# Patient Record
Sex: Female | Born: 1980 | Race: White | Hispanic: No | Marital: Married | Smoking: Never smoker
Health system: Southern US, Community
[De-identification: ages and names within clinical notes are randomized; demographics above are authoritative.]

## PROBLEM LIST (undated history)

## (undated) DIAGNOSIS — N1330 Unspecified hydronephrosis: Secondary | ICD-10-CM

## (undated) DIAGNOSIS — F410 Panic disorder [episodic paroxysmal anxiety] without agoraphobia: Secondary | ICD-10-CM

## (undated) DIAGNOSIS — L709 Acne, unspecified: Secondary | ICD-10-CM

## (undated) DIAGNOSIS — F4321 Adjustment disorder with depressed mood: Secondary | ICD-10-CM

## (undated) HISTORY — PX: ADENOIDECTOMY: SUR15

## (undated) HISTORY — DX: Panic disorder (episodic paroxysmal anxiety): F41.0

## (undated) HISTORY — PX: KIDNEY SURGERY: SHX687

## (undated) HISTORY — DX: Unspecified hydronephrosis: N13.30

## (undated) HISTORY — DX: Acne, unspecified: L70.9

## (undated) HISTORY — DX: Adjustment disorder with depressed mood: F43.21

---

## 2007-08-11 ENCOUNTER — Ambulatory Visit: Payer: Self-pay | Admitting: Obstetrics and Gynecology

## 2008-02-17 ENCOUNTER — Encounter: Payer: Self-pay | Admitting: Maternal & Fetal Medicine

## 2008-03-30 ENCOUNTER — Encounter: Payer: Self-pay | Admitting: Maternal & Fetal Medicine

## 2008-04-25 ENCOUNTER — Ambulatory Visit: Payer: Self-pay | Admitting: Obstetrics and Gynecology

## 2008-08-18 ENCOUNTER — Inpatient Hospital Stay: Payer: Self-pay | Admitting: Obstetrics and Gynecology

## 2011-04-24 ENCOUNTER — Encounter: Payer: Self-pay | Admitting: Obstetrics and Gynecology

## 2011-06-05 ENCOUNTER — Encounter: Payer: Self-pay | Admitting: Maternal & Fetal Medicine

## 2011-10-06 ENCOUNTER — Observation Stay: Payer: Self-pay | Admitting: Obstetrics and Gynecology

## 2011-10-22 ENCOUNTER — Inpatient Hospital Stay: Payer: Self-pay | Admitting: Obstetrics and Gynecology

## 2013-02-28 ENCOUNTER — Ambulatory Visit: Payer: Self-pay | Admitting: Unknown Physician Specialty

## 2013-03-25 ENCOUNTER — Ambulatory Visit: Payer: Self-pay | Admitting: Urology

## 2013-03-25 LAB — HCG, QUANTITATIVE, PREGNANCY: Beta Hcg, Quant.: <1 m[IU]/mL — ABNORMAL LOW

## 2013-03-29 ENCOUNTER — Ambulatory Visit: Payer: Self-pay | Admitting: Urology

## 2013-04-20 ENCOUNTER — Other Ambulatory Visit: Payer: Self-pay | Admitting: Urology

## 2013-04-20 LAB — COMPREHENSIVE METABOLIC PANEL WITH GFR
Albumin: 3.2 g/dL — ABNORMAL LOW
Alkaline Phosphatase: 62 U/L
Anion Gap: 6 — ABNORMAL LOW
BUN: 14 mg/dL
Bilirubin,Total: 0.3 mg/dL
Calcium, Total: 9.6 mg/dL
Chloride: 104 mmol/L
Co2: 27 mmol/L
Creatinine: 0.88 mg/dL
EGFR (African American): >60
EGFR (Non-African Amer.): >60
Glucose: 78 mg/dL
Osmolality: 273
Potassium: 4.5 mmol/L
SGOT(AST): 22 U/L
SGPT (ALT): 29 U/L
Sodium: 137 mmol/L
Total Protein: 7.6 g/dL

## 2013-04-20 LAB — HCG, QUANTITATIVE, PREGNANCY: Beta Hcg, Quant.: <1 m[IU]/mL — ABNORMAL LOW

## 2013-04-21 ENCOUNTER — Ambulatory Visit: Payer: Self-pay | Admitting: Urology

## 2013-05-06 ENCOUNTER — Other Ambulatory Visit: Payer: Self-pay | Admitting: Urology

## 2013-05-11 ENCOUNTER — Ambulatory Visit: Payer: Self-pay | Admitting: Urology

## 2013-08-01 ENCOUNTER — Ambulatory Visit: Payer: Self-pay | Admitting: Urology

## 2014-03-13 LAB — HM PAP SMEAR: HM Pap smear: NEGATIVE

## 2015-04-01 IMAGING — NM NM RENOGRAM W/ LASIX
4 series · 24 of 24 positions shown · non-contrast
Comparison: none

REASON FOR EXAM: Hydronephrosis
COMMENTS:

[Series 1000: renal · 7.79mm/px · 6 of 100 frames shown]
[frame 9/100]
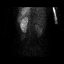
[frame 25/100]
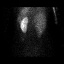
[frame 42/100]
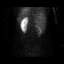
[frame 59/100]
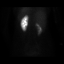
[frame 75/100]
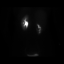
[frame 92/100]
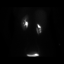

[Series 1000: renal (results) · 7.79mm/px · 6 of 100 frames shown]
[frame 9/100]
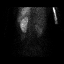
[frame 25/100]
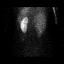
[frame 42/100]
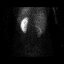
[frame 59/100]
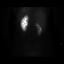
[frame 75/100]
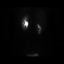
[frame 92/100]
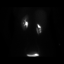

[Series 1000: lasix · 4.80mm/px · 6 of 40 frames shown]
[frame 4/40]
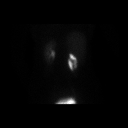
[frame 10/40]
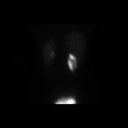
[frame 17/40]
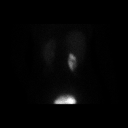
[frame 24/40]
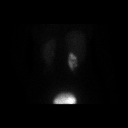
[frame 30/40]
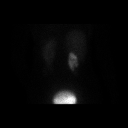
[frame 37/40]
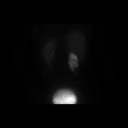

[Series 1000: lasix (results) · 4.80mm/px · 6 of 40 frames shown]
[frame 4/40]
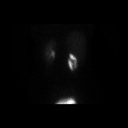
[frame 10/40]
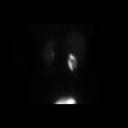
[frame 17/40]
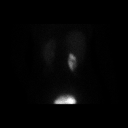
[frame 24/40]
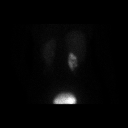
[frame 30/40]
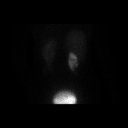
[frame 37/40]
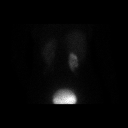

[24 of 24 positions shown; findings below may reference images not displayed]

PROCEDURE:     NM  - NM  RENAL LASIX  2 0F [DATE]  [DATE]

RESULT:     The patient has a double-J left ureteral stent in place. The
patient received a dose of 4.85 mCi of technetium 99 M MAG3 for the study.
The patient also received 20 mg of Lasix intravenously for the washout
portion of the exam. Comparison is made to the previous study performed on
04/21/2013. The patient has also undergone previous CT of the abdomen and
pelvis on 03/25/2013 after which the double-J left ureteral stent was placed
4 long-standing right-sided UPJ obstruction with right renal atrophy.

Arteriographic phase images today shows the presence of delayed accumulation
of tracer in the right kidney compared to the left similar to that seen
previously with evidence of the right kidney smaller than the left
consistent with atrophy. The uptake phase visually and graphically in the
left kidney is slightly blunted which could be from poor bolus or some
extravasation. Percent split function based on the initial images is 82.4%
on the left side and 17.6% on the right. This shows evidence of improvement
in the function of the right kidney compared to the left kidney based on the
previous exam when the differential activity was 94% on the left and 6.025%
on the right. There may be continued improvement with further delay given
the long-standing nature of the obstructive change involving the right
kidney. Correlate with this possibility prior to any intervention.

The patient was given the Lasix administration with further imaging
performed. There is washout of activity from the left kidney and left renal
collecting system with a small amount of activity remaining at the end of 20
minutes of imaging following Lasix administration. The activity in the left
kidney have predominantly been excreted prior to Lasix administration but
there was some washout of activity from the left kidney during the Lasix
washout images. The washout curve of the right kidney shows no evidence of
obstruction with stent in place.
IMPRESSION: 1. There is improvement in function of the right kidney from 6% differential
function to approximately 17.6% differential function compare to the
previous study approximately 3 weeks prior. No obstruction is present with
the stent in place. There is atrophy involving the right kidney and there is
a blunted angiographic phase visually and graphically which could represent
the sequela of continued slightly poor renal function from chronic
long-standing obstructive change.

[REDACTED]

## 2015-04-06 NOTE — Op Note (Signed)
PATIENT NAME:  Lindsay Santos, Lindsay Santos MR#:  [id number] DATE OF BIRTH:  [birth year]  DATE OF PROCEDURE:  03/29/2013  PREOPERATIVE DIAGNOSES:  1.  Right ureteropelvic junction obstruction.  2.  Right hydronephrosis.   POSTOPERATIVE DIAGNOSES: 1.  Right ureteropelvic junction obstruction.  2.  Right hydronephrosis.   PROCEDURE: 1.  Balloon dilatation of right ureteropelvic junction. 2.  Cystoscopy with retrograde pyelogram.  3.  Right double pigtail stent placement.  4.  Fluoroscopy.   SURGEON: Anola GurneyMichael Fawzi Melman, M.D.   ANESTHETIST: Darleene CleaverVan Staveren.   ANESTHESIA: General and local per surgeon.   INDICATIONS: See the dictated history and physical. After informed consent, the patient requests the above procedure.   OPERATIVE SUMMARY: After adequate general anesthesia had been obtained, the patient was placed into dorsal lithotomy position and the perineum was prepped and draped in the usual fashion. A 21-French cystoscope was coupled with a camera and then placed into the bladder. The bladder was thoroughly inspected. Both ureteral orifices were identified and had clear efflux. At this point, an 8-French cone-tipped ureteral catheter was advanced into the right orifice and retrograde pyelogram was performed. The patient had a less than 1 mm narrowing at the UPJ. Contrast was seen entering the kidney, which was dilated. At this point, a 0.035 Glidewire was passed up the right ureter and curled into the renal pelvis under fluoroscopic guidance. An 8 mm x 4 cm balloon dilating catheter was positioned at the UPJ. The UPJ was dilated to 8 mm. The balloon catheter was then removed. A second balloon catheter was placed at the UVJ and the UVJ was dilated to 8 mm also. At this point, an 8-French double pigtail stent was passed over the guidewire and positioned in the ureter. Guidewire was removed taking care to leave the stent in position. The bladder was drained and the scope was removed. Viscous Xylocaine 10 mL was  instilled within the urethra and the bladder. B and O suppository was placed. The procedure was then terminated and the patient was transferred to the recovery room in stable condition.    ____________________________ Suszanne ConnersMichael R. Evelene CroonWolff, MD mrw:aw D: 03/29/2013 08:58:18 ET T: 03/29/2013 09:04:53 ET JOB#: [id number]  cc: Suszanne ConnersMichael R. Evelene CroonWolff, MD, <Dictator> Orson ApeMICHAEL R Michole Lecuyer MD ELECTRONICALLY SIGNED 03/30/2013 9:54

## 2015-04-06 NOTE — H&P (Signed)
PATIENT NAME:  Lindsay Santos, Lindsay Santos DATE OF BIRTH:  June 11, 1981  DATE OF ADMISSION:  08/01/2013  CHIEF COMPLAINT:  Right UPJ obstruction.    HISTORY OF PRESENT ILLNESS:  Lindsay Santos is a 34 year old Caucasian female with right UPJ obstruction, hydronephrosis, and partial loss of renal function on the right side. She underwent balloon dilatation of the UPJ with stent placement April 15. She has had improvement of her renal function, per renal scan, from initial function level of 6% to improved function of 17.6% on May 20. She comes in today for stent retrieval, cysto with stent removal, ureteroscopy, and retrograde pyelogram.   ALLERGIES:  No drug allergies.   CURRENT MEDICATIONS: Includes desipramine, Differin, birth control pills and omeprazole.   PREVIOUS SURGICAL PROCEDURES:  Include tonsillectomy in 1983.   SOCIAL HISTORY:  She denied tobacco use. Consumes alcohol socially.   FAMILY HISTORY: Remarkable for hypercholesterolemia.   PAST AND CURRENT MEDICAL CONDITIONS:  Acne.  REVIEW OF SYSTEMS: The patient denies chest pain, shortness of breath, diabetes, stroke, hypertension.  PHYSICAL EXAMINATION: GENERAL:  A well-nourished white female in no acute distress.  HEENT: Sclerae were clear. Pupils were equally round and reactive to light and accommodation. Extraocular movements intact.  NECK:  Supple. No palpable cervical adenopathy.  LUNGS:  Clear to auscultation.  CARDIOVASCULAR:  Regular rhythm and rate, without audible murmurs.  ABDOMINAL EXAM:  Soft, nontender abdomen. No CVA tenderness.  GENITOURINARY AND RECTAL EXAMS:  Deferred.   NEUROMUSCULAR EXAM:  Alert and oriented x 3.   IMPRESSIONS: 1. Chronic left ureteropelvic junction obstruction, status post balloon dilatation and stent placement.  2. History of abdominal pain due to hydronephrosis and ureteropelvic junction obstruction -resolved.   PLAN:  Cysto with stent retrieval, ureteroscopy, and retrograde  pyelogram.        ____________________________ Suszanne ConnersMichael R. Evelene CroonWolff, MD mrw:mr D: 07/27/2013 17:37:38 ET T: 07/27/2013 19:07:54 ET JOB#: [id number]  cc: Suszanne ConnersMichael R. Evelene CroonWolff, MD, <Dictator> Orson ApeMICHAEL R Geneive Sandstrom MD ELECTRONICALLY SIGNED 08/01/2013 7:24

## 2015-04-06 NOTE — H&P (Signed)
PATIENT NAME:  Lindsay Santos, Lindsay Santos MR#:  [id number] DATE OF BIRTH:  1981/02/08  DATE OF ADMISSION:  03/29/2013  CHIEF COMPLAINT: Abdominal pain.   HISTORY OF PRESENT ILLNESS: The patient is a 34 year old Caucasian female who presented to the office in March with a 4 to 5-day history of periumbilical pain radiating to her right flank. She was subsequently evaluated with IVP and CT which indicated that she had a hydronephrotic sac present on the right side with minimal function. She comes in now for cystoscopy, retrograde stent placement and possible balloon dilatation of the UPJ on the right side.   ALLERGIES: No drug allergies.   CURRENT MEDICATIONS: Desipramine, Differin, birth control pills and omeprazole.   PAST SURGICAL HISTORY: Tonsillectomy in 1983.   SOCIAL HISTORY: She denied tobacco use. She consumes alcohol socially.   FAMILY HISTORY: Remarkable for hypercholesterolemia.   PAST AND CURRENT MEDICAL CONDITIONS: Remarkable for acne.   REVIEW OF SYSTEMS: She denied heart disease, lung disease, diabetes, stroke or hypertension.   PHYSICAL EXAMINATION: GENERAL: A well-nourished white female in no distress.  HEENT: Sclerae were clear. Pupils were equal, round and reactive to light and accommodation. Extraocular movements were intact.  NECK: Supple. No palpable cervical adenopathy.  LUNGS: Clear to auscultation.  CARDIOVASCULAR: Regular rhythm and rate without audible murmurs.  ABDOMEN: Soft abdomen, slightly tender in the mid abdomen. No palpable abdominal masses.   GENITOURINARY: Deferred.  RECTAL: Deferred.  NEUROMUSCULAR: Nonfocal.   IMPRESSION:  1. Severe right hydronephrosis with loss of renal function on the right probably due to chronic right UPJ obstruction.   2. Abdominal pain possibly due to the hydronephrotic sac.   PLAN: Cystoscopy, right retrograde with stent placement, possible ureteroscopy,  and possible balloon dilatation of the UPJ.   ____________________________ Suszanne ConnersMichael R. Evelene CroonWolff, MD mrw:cb D: 03/28/2013 17:16:35 ET T: 03/28/2013 17:29:22 ET JOB#: [id number]  cc: Suszanne ConnersMichael R. Evelene CroonWolff, MD, <Dictator> Orson ApeMICHAEL R Cuca Benassi MD ELECTRONICALLY SIGNED 03/28/2013 19:40

## 2015-04-06 NOTE — Op Note (Signed)
PATIENT NAME:  Lindsay Santos, Lindsay Santos MR#:  [id number] DATE OF BIRTH:  December 12, 1981  DATE OF PROCEDURE:  08/01/2013  PREOPERATIVE DIAGNOSES:  Rhodia Albright1.  Right ureteropelvic junction obstruction.  2.  Right renal atrophy.   POSTOPERATIVE DIAGNOSES: 1.  Right ureteropelvic junction obstruction.  2.  Right renal atrophy.   PROCEDURES:  1.  Cystoscopy with stent removal.  2.  Right retrograde pyelogram.  3.  Fluoroscopy.   SURGEON: Anola GurneyMichael Wolff, M.D.   ANESTHETIST: Linna Hoffarroll and Wolff.  ANESTHETIC METHOD: General per Noralyn Pickarroll and local per Dr. Evelene CroonWolff.   INDICATIONS: See the dictated history and physical. After informed consent, the patient requests the above procedure.   OPERATIVE SUMMARY: After adequate general anesthesia had been obtained, the patient was placed into dorsal lithotomy position and the perineum was prepped and draped in the usual fashion. A 21-French cystoscope sheath was advanced in the bladder with the obturator in place. Cystoscope was coupled with the camera and then placed into the sheath. The bladder was thoroughly inspected. Right ureteral stent was identified. No bladder lesions were identified. Using the alligator forceps, the stent was removed. An 8-French cone-tipped ureteral catheter was then advanced into the right orifice and retrograde pyelogram was performed with fluoroscopy and static images. The UPJ appeared to be patent and drainage films showed prompt drainage of contrast out of the kidney. At this point, the bladder was drained and cystoscope was removed. 10 mL of viscous Xylocaine was instilled within the urethra and the bladder. The procedure was terminated and the patient was transferred to the recovery room in stable condition.    ____________________________ Suszanne ConnersMichael R. Evelene CroonWolff, MD mrw:aw D: 08/01/2013 08:11:14 ET T: 08/01/2013 08:25:45 ET JOB#: [id number]  cc: Suszanne ConnersMichael R. Evelene CroonWolff, MD, <Dictator> Orson ApeMICHAEL R WOLFF MD ELECTRONICALLY SIGNED 08/02/2013 8:26

## 2015-09-25 ENCOUNTER — Ambulatory Visit: Payer: Self-pay | Admitting: Obstetrics and Gynecology

## 2016-02-22 ENCOUNTER — Telehealth: Payer: Self-pay | Admitting: Obstetrics and Gynecology

## 2016-02-22 NOTE — Telephone Encounter (Signed)
REFILL ON ANXIETY MED, NEEDS FOR FLIGHT OUT OF COUNTRY  (TRIAZOLAM)

## 2016-02-22 NOTE — Telephone Encounter (Signed)
LM informing pt will have to ask mad for refill.

## 2016-03-18 NOTE — Telephone Encounter (Signed)
Pt aware she will need to get from dr who ordered. Pt states mad ordered- I see where he ordered ativan after mother passed away. See allscript note dated 12/12/2014. Pt will f/u with mad at her [facility] in a few weeks.

## 2016-04-01 ENCOUNTER — Encounter: Payer: Self-pay | Admitting: Obstetrics and Gynecology

## 2016-04-01 ENCOUNTER — Ambulatory Visit (INDEPENDENT_AMBULATORY_CARE_PROVIDER_SITE_OTHER): Payer: Managed Care, Other (non HMO) | Admitting: Obstetrics and Gynecology

## 2016-04-01 VITALS — BP 104/68 | HR 71 | Ht 64.0 in | Wt 124.4 lb

## 2016-04-01 DIAGNOSIS — Z01419 Encounter for gynecological examination (general) (routine) without abnormal findings: Secondary | ICD-10-CM | POA: Diagnosis not present

## 2016-04-01 DIAGNOSIS — F410 Panic disorder [episodic paroxysmal anxiety] without agoraphobia: Secondary | ICD-10-CM

## 2016-04-01 DIAGNOSIS — Z3041 Encounter for surveillance of contraceptive pills: Secondary | ICD-10-CM | POA: Diagnosis not present

## 2016-04-01 MED ORDER — JUNEL FE 1/20 1-20 MG-MCG PO TABS: 1.0000 | ORAL_TABLET | Freq: Every day | ORAL | Status: DC

## 2016-04-01 NOTE — Patient Instructions (Signed)
1. No Pap smear until 2018 2. Self breast exam monthly encouraged 3. Continue healthy eating with exercise 4. Screening labs are pending today 5. Birth control pill prescription refilled 6. Return in 1 year 7. No medications for panic/anxiety at this time. If medication is needed, contact us

## 2016-04-01 NOTE — Progress Notes (Signed)
Patient ID: Lindsay Santos, female   DOB: 12-Apr-1981, 35 y.o.   MRN: [id number] [facility]NUAL [facility] GYN  ENCOUNTER NOTE  Subjective:       Lindsay Santos is a 35 y.o. [phone] female here for a routine annual gynecologic exam.  Current complaints: 1.  D/c paxil- pt not longer needs   Wants halcion   Cycles are regular on OCPs without breakthrough bleeding Bowel function normal Bladder function normal Patient reports discontinuing Paxil for panic/anxiety and is doing much better. She came off medication following grief counseling after mother's and is pleased being off all meds. Patient states that she still has some anxiety with prolonged air travel; should she require medication future, she will contact the office. She does understand that I will not prescribe long-term benzodiazepines. After reviewing Halcion prescription, this short acting benzodiazepine may be utilized in select circumstances.   Gynecologic History Patient's last menstrual period was 03/04/2016 (approximate).  Pap- 02/14/2014 neg/neg Contraception: OCP (estrogen/progesterone)  Last mammogram: n/a. Results were: n/a  Obstetric History OB History  Gravida Para Term Preterm AB SAB TAB Ectopic Multiple Living  # Outcome Date GA Lbr Len/2nd Weight Sex Delivery Anes PTL Lv  2 Term      Vag-Spont   Y  1 Term      VBAC   Y      Past Medical History  Diagnosis Date  . Hydronephrosis   . Panic anxiety syndrome   . Grief     Past Surgical History  Procedure Laterality Date  . Kidney surgery      rt kidney stent and removal  . Adenoidectomy      Current Outpatient Prescriptions on File Prior to Visit  Medication Sig Dispense Refill  . JUNEL FE 1/20 1-20 MG-MCG tablet TAKE 1 TABLET BY MOUTH DAILY CONTINUOUSLY  4   No current facility-administered medications on file prior to visit.    No Known Allergies  Social History   Social History  . Marital Status: Married    Spouse Name:  N/A  . Number of Children: N/A  . Years of Education: N/A   Occupational History  . Not on file.   Social History Main Topics  . Smoking status: Never Smoker   . Smokeless tobacco: Not on file  . Alcohol Use: Yes     Comment: moderate alcohol - glass of wine at nite  . Drug Use: No  . Sexual Activity: Yes    Birth Control/ Protection: Pill   Other Topics Concern  . Not on file   Social History Narrative    Family History  Problem Relation Age of Onset  . Cancer Neg Hx   . Diabetes Neg Hx   . Heart disease Neg Hx     The following portions of the patient's history were reviewed and updated as appropriate: allergies, current medications, past family history, past medical history, past social history, past surgical history and problem list.  Review of Systems ROS Review of Systems - General ROS: negative for - chills, fatigue, fever, hot flashes, night sweats, weight gain or weight loss Psychological ROS: negative for - anxiety, decreased libido, depression, mood swings, physical abuse or sexual abuse Ophthalmic ROS: negative for - blurry vision, eye pain or loss of vision ENT ROS: negative for - headaches, hearing change, visual changes or vocal changes Allergy and Immunology ROS: negative for - hives, itchy/watery eyes or seasonal  allergies Hematological and Lymphatic ROS: negative for - bleeding problems, bruising, swollen lymph nodes or weight loss Endocrine ROS: negative for - galactorrhea, hair pattern changes, hot flashes, malaise/lethargy, mood swings, palpitations, polydipsia/polyuria, skin changes, temperature intolerance or unexpected weight changes Breast ROS: negative for - new or changing breast lumps or nipple discharge Respiratory ROS: negative for - cough or shortness of breath Cardiovascular ROS: negative for - chest pain, irregular heartbeat, palpitations or shortness of breath Gastrointestinal ROS: no abdominal pain, change in bowel habits, or black or  bloody stools Genito-Urinary ROS: no dysuria, trouble voiding, or hematuria Musculoskeletal ROS: negative for - joint pain or joint stiffness Neurological ROS: negative for - bowel and bladder control changes Dermatological ROS: negative for rash and skin lesion changes   Objective:   BP 104/68 mmHg  Pulse 71  Ht 5\' 4"  (1.626 m)  Wt 124 lb 6.4 oz (56.427 kg)  BMI 21.34 kg/m2  LMP 03/04/2016 (Approximate) CONSTITUTIONAL: Well-developed, well-nourished female in no acute distress.  PSYCHIATRIC: Normal mood and affect. Normal behavior. Normal judgment and thought content. NEUROLGIC: Alert and oriented to person, place, and time. Normal muscle tone coordination. No cranial nerve deficit noted. HENT:  Normocephalic, atraumatic, External right and left ear normal. Oropharynx is clear and moist EYES: Conjunctivae and EOM are normal.  No scleral icterus.  NECK: Normal range of motion, supple, no masses.  Normal thyroid.  SKIN: Skin is warm and dry. No rash noted. Not diaphoretic. No erythema. No pallor. CARDIOVASCULAR: Normal heart rate noted, regular rhythm, no murmur. RESPIRATORY: Clear to auscultation bilaterally. Effort and breath sounds normal, no problems with respiration noted. BREASTS: Symmetric in size. No masses, skin changes, nipple drainage, or lymphadenopathy. ABDOMEN: Soft, normal bowel sounds, no distention noted.  No tenderness, rebound or guarding.  BLADDER: Normal PELVIC:  External Genitalia: Normal  BUS: Normal  Vagina: Normal  Cervix: Normal; friable  Uterus: Normal  Adnexa: Normal  RV: External Exam NormaI  MUSCULOSKELETAL: Normal range of motion. No tenderness.  No cyanosis, clubbing, or edema.  2+ distal pulses. LYMPHATIC: No Axillary, Supraclavicular, or Inguinal Adenopathy.    Assessment:   Annual gynecologic examination 35 y.o. Contraception: OCP (estrogen/progesterone) bmi-21 Panic/anxiety, off medications, stable  Plan:  Pap: Not needed Mammogram:  Not Indicated Stool Guaiac Testing:  Not Indicated Labs: lipid vit tsh fbs a1c and CBC Routine preventative health maintenance measures emphasized: Exercise/Diet/Weight control, Tobacco Warnings, Alcohol/Substance use risks and Stress Management OCPs refilled Return to Clinic - 1 Year   Tylar Amborn McCartys VillageMiller, CMA  Herold HarmsMartin A Defrancesco, MD  Note: This dictation was prepared with Dragon dictation along with smaller phrase technology. Any transcriptional errors that result from this process are unintentional.

## 2016-04-02 ENCOUNTER — Telehealth: Payer: Self-pay

## 2016-04-02 LAB — CBC WITH DIFFERENTIAL/PLATELET
BASOS ABS: 0 10*3/uL (ref 0.0–0.2)
Basos: 1 %
EOS (ABSOLUTE): 0.1 10*3/uL (ref 0.0–0.4)
Eos: 1 %
Hematocrit: 46.6 % (ref 34.0–46.6)
Hemoglobin: 15.5 g/dL (ref 11.1–15.9)
IMMATURE GRANS (ABS): 0 10*3/uL (ref 0.0–0.1)
Immature Granulocytes: 0 %
LYMPHS: 32 %
Lymphocytes Absolute: 2.1 10*3/uL (ref 0.7–3.1)
MCH: 30.6 pg (ref 26.6–33.0)
MCHC: 33.3 g/dL (ref 31.5–35.7)
MCV: 92 fL (ref 79–97)
Monocytes Absolute: 0.4 10*3/uL (ref 0.1–0.9)
Monocytes: 7 %
NEUTROS ABS: 3.9 10*3/uL (ref 1.4–7.0)
NEUTROS PCT: 59 %
PLATELETS: 255 10*3/uL (ref 150–379)
RBC: 5.06 x10E6/uL (ref 3.77–5.28)
RDW: 13.3 % (ref 12.3–15.4)
WBC: 6.6 10*3/uL (ref 3.4–10.8)

## 2016-04-02 LAB — GLUCOSE, RANDOM: GLUCOSE: 88 mg/dL (ref 65–99)

## 2016-04-02 LAB — LIPID PANEL
CHOLESTEROL TOTAL: 211 mg/dL — AB (ref 100–199)
Chol/HDL Ratio: 3.3 ratio units (ref 0.0–4.4)
HDL: 63 mg/dL (ref >39)
LDL CALC: 122 mg/dL — AB (ref 0–99)
Triglycerides: 131 mg/dL (ref 0–149)
VLDL Cholesterol Cal: 26 mg/dL (ref 5–40)

## 2016-04-02 LAB — VITAMIN D 25 HYDROXY (VIT D DEFICIENCY, FRACTURES): VIT D 25 HYDROXY: 29.1 ng/mL — AB (ref 30.0–100.0)

## 2016-04-02 LAB — TSH: TSH: 2.65 u[IU]/mL (ref 0.450–4.500)

## 2016-04-02 LAB — HEMOGLOBIN A1C
Est. average glucose Bld gHb Est-mCnc: 111 mg/dL
Hgb A1c MFr Bld: 5.5 % (ref 4.8–5.6)

## 2016-04-02 NOTE — Telephone Encounter (Signed)
-----   Message from Herold HarmsMartin A Defrancesco, MD sent at 04/02/2016  8:22 AM EDT ----- Please Notify - Labs normal Bimanual borderline normal Lipid panel shows slightly elevated total cholesterol and LDL; however ratio is consistent with low risk patient for cardiac disease; repeat in one year

## 2016-04-02 NOTE — Telephone Encounter (Signed)
Pt aware.

## 2016-05-28 ENCOUNTER — Other Ambulatory Visit: Payer: Self-pay | Admitting: Obstetrics and Gynecology

## 2017-04-02 ENCOUNTER — Other Ambulatory Visit: Payer: Self-pay | Admitting: Obstetrics and Gynecology

## 2017-04-02 ENCOUNTER — Encounter: Payer: Managed Care, Other (non HMO) | Admitting: Obstetrics and Gynecology

## 2017-04-16 NOTE — Progress Notes (Signed)
Patient ID: Lindsay Santos, female   DOB: [birth year], 36 y.o.   MRN: [id number] ANNUAL PREVENTATIVE CARE GYN  ENCOUNTER NOTE  Subjective:       Lindsay Santos is a 36 y.o. [id number] female here for a routine annual gynecologic exam.  Current complaints: 1. Wants ativan refill- traveling soon  Cycles are regular on OCPs without breakthrough bleeding Bowel function normal Bladder function normal Patient reports discontinuing Paxil for panic/anxiety and is doing much better. She came off medication following grief counseling after mother's and is pleased being off all meds. Patient states that she still has some anxiety with prolonged air travel; should she require medication future, she will contact the office. She does understand that I will not prescribe long-term benzodiazepines. After reviewing Halcion prescription, this short acting benzodiazepine may be utilized in select circumstances.   Gynecologic History No LMP recorded.  Pap- 02/14/2014 neg/neg Contraception: OCP (estrogen/progesterone)  Last mammogram: n/a. Results were: n/a  Obstetric History OB History  Gravida Para Term Preterm AB Living  2 2 2     2   SAB TAB Ectopic Multiple Live Births          2    # Outcome Date GA Lbr Len/2nd Weight Sex Delivery Anes PTL Lv  2 Term      Vag-Spont   LIV  1 Term      VBAC   LIV      Past Medical History:  Diagnosis Date  . Grief   . Hydronephrosis   . Panic anxiety syndrome     Past Surgical History:  Procedure Laterality Date  . ADENOIDECTOMY    . KIDNEY SURGERY     rt kidney stent and removal    Current Outpatient Prescriptions on File Prior to Visit  Medication Sig Dispense Refill  . ACZONE 7.5 % GEL     . doxycycline (ORACEA) 40 MG capsule Take 40 mg by mouth every morning.    Colleen Can FE 1/20 1-20 MG-MCG tablet TAKE 1 TABLET BY MOUTH DAILY CONTINUOUSLY 112 tablet 3  . JUNEL FE 1/20 1-20 MG-MCG tablet TAKE 1 TABLET BY MOUTH DAILY. 84 tablet 0   No current  facility-administered medications on file prior to visit.     No Known Allergies  Social History   Social History  . Marital status: Married    Spouse name: N/A  . Number of children: N/A  . Years of education: N/A   Occupational History  . Not on file.   Social History Main Topics  . Smoking status: Never Smoker  . Smokeless tobacco: Not on file  . Alcohol use Yes     Comment: moderate alcohol - glass of wine at nite  . Drug use: No  . Sexual activity: Yes    Birth control/ protection: Pill   Other Topics Concern  . Not on file   Social History Narrative  . No narrative on file    Family History  Problem Relation Age of Onset  . Cancer Neg Hx   . Diabetes Neg Hx   . Heart disease Neg Hx     The following portions of the patient's history were reviewed and updated as appropriate: allergies, current medications, past family history, past medical history, past social history, past surgical history and problem list.  Review of Systems Review of Systems  Constitutional: Negative.   HENT: Negative.   Eyes: Negative.   Respiratory: Negative.   Cardiovascular: Negative.   Gastrointestinal: Negative.  Genitourinary: Negative.   Musculoskeletal: Negative.   Skin: Negative.   Endo/Heme/Allergies: Negative.   Psychiatric/Behavioral: The patient is nervous/anxious.      Objective:   BP 95/67   Pulse 84   Ht 5\' 4"  (1.626 m)   Wt 118 lb 11.2 oz (53.8 kg)   LMP 04/21/2017 (Exact Date)   BMI 20.37 kg/m CONSTITUTIONAL: Well-developed, well-nourished female in no acute distress.  PSYCHIATRIC: Normal mood and affect. Normal behavior. Normal judgment and thought content. NEUROLGIC: Alert and oriented to person, place, and time. Normal muscle tone coordination. No cranial nerve deficit noted. HENT:  Normocephalic, atraumatic, External right and left ear normal. Oropharynx is clear and moist EYES: Conjunctivae and EOM are normal.  No scleral icterus.  NECK: Normal  range of motion, supple, no masses.  Normal thyroid.  SKIN: Skin is warm and dry. No rash noted. Not diaphoretic. No erythema. No pallor. CARDIOVASCULAR: Normal heart rate noted, regular rhythm, no murmur. RESPIRATORY: Clear to auscultation bilaterally. Effort and breath sounds normal, no problems with respiration noted. BREASTS: Symmetric in size. No masses, skin changes, nipple drainage, or lymphadenopathy. ABDOMEN: Soft, normal bowel sounds, no distention noted.  No tenderness, rebound or guarding.  BLADDER: Normal PELVIC:  External Genitalia: Normal  BUS: Normal  Vagina: Normal  Cervix: Normal; friable  Uterus: Normal; midplane to anteverted, small, mobile, nontender  Adnexa: Normal; nonpalpable nontender  RV: External Exam NormaI  MUSCULOSKELETAL: Normal range of motion. No tenderness.  No cyanosis, clubbing, or edema.  2+ distal pulses. LYMPHATIC: No Axillary, Supraclavicular, or Inguinal Adenopathy.    Assessment:   Annual gynecologic examination 36 y.o. Contraception: OCP (estrogen/progesterone) bmi-21 Panic/anxiety, off medications, stable Major travel upcoming, requesting short acting benzodiazepine (previously prescribed)  Plan:  Pap: pap w/hpv Mammogram: Not Indicated Stool Guaiac Testing:  Not Indicated Labs: lipid vit tsh fbs a1c  Routine preventative health maintenance measures emphasized: Exercise/Diet/Weight control, Tobacco Warnings, Alcohol/Substance use risks and Stress Management OCPs refilled Ativan 1 mg orally every 4 hours as needed during travel; 15 tabs; no refills Return to Clinic - 1 Year  SunGardCrystal Miller, CMA  Herold HarmsMartin A Semaya Vida, MD   Note: This dictation was prepared with Dragon dictation along with smaller phrase technology. Any transcriptional errors that result from this process are unintentional.

## 2017-04-22 ENCOUNTER — Ambulatory Visit (INDEPENDENT_AMBULATORY_CARE_PROVIDER_SITE_OTHER): Payer: Managed Care, Other (non HMO) | Admitting: Obstetrics and Gynecology

## 2017-04-22 ENCOUNTER — Encounter: Payer: Self-pay | Admitting: Obstetrics and Gynecology

## 2017-04-22 VITALS — BP 95/67 | HR 84 | Ht 64.0 in | Wt 118.7 lb

## 2017-04-22 DIAGNOSIS — F410 Panic disorder [episodic paroxysmal anxiety] without agoraphobia: Secondary | ICD-10-CM | POA: Diagnosis not present

## 2017-04-22 DIAGNOSIS — Z01419 Encounter for gynecological examination (general) (routine) without abnormal findings: Secondary | ICD-10-CM

## 2017-04-22 DIAGNOSIS — Z3041 Encounter for surveillance of contraceptive pills: Secondary | ICD-10-CM

## 2017-04-22 MED ORDER — NORETHIN ACE-ETH ESTRAD-FE 1-20 MG-MCG PO TABS: 1.0000 | ORAL_TABLET | Freq: Every day | ORAL | 3 refills | Status: DC

## 2017-04-22 MED ORDER — LORAZEPAM 1 MG PO TABS: 1.0000 mg | ORAL_TABLET | Freq: Two times a day (BID) | ORAL | 0 refills | Status: DC

## 2017-04-22 NOTE — Patient Instructions (Addendum)
1. Pap smear is done 2. Continue self breast awareness 3. Screening labs are ordered 4. Continue with healthy eating and exercise 5. Birth control pills are refilled for 1 year 6. Return in 1 year for annual exam 7. Ativan 1 mg tabs; over 50; no refill Health Maintenance, Female Adopting a healthy lifestyle and getting preventive care can go a long way to promote health and wellness. Talk with your health care provider about what schedule of regular examinations is right for you. This is a good chance for you to check in with your provider about disease prevention and staying healthy. In between checkups, there are plenty of things you can do on your own. Experts have done a lot of research about which lifestyle changes and preventive measures are most likely to keep you healthy. Ask your health care provider for more information. Weight and diet Eat a healthy diet  Be sure to include plenty of vegetables, fruits, low-fat dairy products, and lean protein.  Do not eat a lot of foods high in solid fats, added sugars, or salt.  Get regular exercise. This is one of the most important things you can do for your health.  Most adults should exercise for at least 150 minutes each week. The exercise should increase your heart rate and make you sweat (moderate-intensity exercise).  Most adults should also do strengthening exercises at least twice a week. This is in addition to the moderate-intensity exercise. Maintain a healthy weight  Body mass index (BMI) is a measurement that can be used to identify possible weight problems. It estimates body fat based on height and weight. Your health care provider can help determine your BMI and help you achieve or maintain a healthy weight.  For females 51 years of age and older:  A BMI below 18.5 is considered underweight.  A BMI of 18.5 to 24.9 is normal.  A BMI of 25 to 29.9 is considered overweight.  A BMI of 30 and above is considered obese. Watch  levels of cholesterol and blood lipids  You should start having your blood tested for lipids and cholesterol at 38 years of age, then have this test every 5 years.  You may need to have your cholesterol levels checked more often if:  Your lipid or cholesterol levels are high.  You are older than 38 years of age.  You are at high risk for heart disease. Cancer screening Lung Cancer  Lung cancer screening is recommended for adults 60-75 years old who are at high risk for lung cancer because of a history of smoking.  A yearly low-dose CT scan of the lungs is recommended for people who:  Currently smoke.  Have quit within the past 15 years.  Have at least a 30-pack-year history of smoking. A pack year is smoking an average of one pack of cigarettes a day for 1 year.  Yearly screening should continue until it has been 15 years since you quit.  Yearly screening should stop if you develop a health problem that would prevent you from having lung cancer treatment. Breast Cancer  Practice breast self-awareness. This means understanding how your breasts normally appear and feel.  It also means doing regular breast self-exams. Let your health care provider know about any changes, no matter how small.  If you are in your 20s or 30s, you should have a clinical breast exam (CBE) by a health care provider every 1-3 years as part of a regular health exam.  If you  are 78 or older, have a CBE every year. Also consider having a breast X-ray (mammogram) every year.  If you have a family history of breast cancer, talk to your health care provider about genetic screening.  If you are at high risk for breast cancer, talk to your health care provider about having an MRI and a mammogram every year.  Breast cancer gene (BRCA) assessment is recommended for women who have family members with BRCA-related cancers. BRCA-related cancers include:  Breast.  Ovarian.  Tubal.  Peritoneal  cancers.  Results of the assessment will determine the need for genetic counseling and BRCA1 and BRCA2 testing. Cervical Cancer  Your health care provider may recommend that you be screened regularly for cancer of the pelvic organs (ovaries, uterus, and vagina). This screening involves a pelvic examination, including checking for microscopic changes to the surface of your cervix (Pap test). You may be encouraged to have this screening done every 3 years, beginning at age 54.  For women ages 36-65, health care providers may recommend pelvic exams and Pap testing every 3 years, or they may recommend the Pap and pelvic exam, combined with testing for human papilloma virus (HPV), every 5 years. Some types of HPV increase your risk of cervical cancer. Testing for HPV may also be done on women of any age with unclear Pap test results.  Other health care providers may not recommend any screening for nonpregnant women who are considered low risk for pelvic cancer and who do not have symptoms. Ask your health care provider if a screening pelvic exam is right for you.  If you have had past treatment for cervical cancer or a condition that could lead to cancer, you need Pap tests and screening for cancer for at least 20 years after your treatment. If Pap tests have been discontinued, your risk factors (such as having a new sexual partner) need to be reassessed to determine if screening should resume. Some women have medical problems that increase the chance of getting cervical cancer. In these cases, your health care provider may recommend more frequent screening and Pap tests. Colorectal Cancer  This type of cancer can be detected and often prevented.  Routine colorectal cancer screening usually begins at 38 years of age and continues through 38 years of age.  Your health care provider may recommend screening at an earlier age if you have risk factors for colon cancer.  Your health care provider may also  recommend using home test kits to check for hidden blood in the stool.  A small camera at the end of a tube can be used to examine your colon directly (sigmoidoscopy or colonoscopy). This is done to check for the earliest forms of colorectal cancer.  Routine screening usually begins at age 43.  Direct examination of the colon should be repeated every 5-10 years through 38 years of age. However, you may need to be screened more often if early forms of precancerous polyps or small growths are found. Skin Cancer  Check your skin from head to toe regularly.  Tell your health care provider about any new moles or changes in moles, especially if there is a change in a mole's shape or color.  Also tell your health care provider if you have a mole that is larger than the size of a pencil eraser.  Always use sunscreen. Apply sunscreen liberally and repeatedly throughout the day.  Protect yourself by wearing long sleeves, pants, a wide-brimmed hat, and sunglasses whenever you are  outside. Heart disease, diabetes, and high blood pressure  High blood pressure causes heart disease and increases the risk of stroke. High blood pressure is more likely to develop in:  People who have blood pressure in the high end of the normal range (130-139/85-89 mm Hg).  People who are overweight or obese.  People who are African American.  If you are 61-54 years of age, have your blood pressure checked every 3-5 years. If you are 73 years of age or older, have your blood pressure checked every year. You should have your blood pressure measured twice-once when you are at a hospital or clinic, and once when you are not at a hospital or clinic. Record the average of the two measurements. To check your blood pressure when you are not at a hospital or clinic, you can use:  An automated blood pressure machine at a pharmacy.  A home blood pressure monitor.  If you are between 53 years and 82 years old, ask your health  care provider if you should take aspirin to prevent strokes.  Have regular diabetes screenings. This involves taking a blood sample to check your fasting blood sugar level.  If you are at a normal weight and have a low risk for diabetes, have this test once every three years after 38 years of age.  If you are overweight and have a high risk for diabetes, consider being tested at a younger age or more often. Preventing infection Hepatitis B  If you have a higher risk for hepatitis B, you should be screened for this virus. You are considered at high risk for hepatitis B if:  You were born in a country where hepatitis B is common. Ask your health care provider which countries are considered high risk.  Your parents were born in a high-risk country, and you have not been immunized against hepatitis B (hepatitis B vaccine).  You have HIV or AIDS.  You use needles to inject street drugs.  You live with someone who has hepatitis B.  You have had sex with someone who has hepatitis B.  You get hemodialysis treatment.  You take certain medicines for conditions, including cancer, organ transplantation, and autoimmune conditions. Hepatitis C  Blood testing is recommended for:  Everyone born from [birth year] through 1967.  Anyone with known risk factors for hepatitis C. Sexually transmitted infections (STIs)  You should be screened for sexually transmitted infections (STIs) including gonorrhea and chlamydia if:  You are sexually active and are younger than 38 years of age.  You are older than 38 years of age and your health care provider tells you that you are at risk for this type of infection.  Your sexual activity has changed since you were last screened and you are at an increased risk for chlamydia or gonorrhea. Ask your health care provider if you are at risk.  If you do not have HIV, but are at risk, it may be recommended that you take a prescription medicine daily to prevent HIV  infection. This is called pre-exposure prophylaxis (PrEP). You are considered at risk if:  You are sexually active and do not regularly use condoms or know the HIV status of your partner(s).  You take drugs by injection.  You are sexually active with a partner who has HIV. Talk with your health care provider about whether you are at high risk of being infected with HIV. If you choose to begin PrEP, you should first be tested for HIV. You should  then be tested every 3 months for as long as you are taking PrEP. Pregnancy  If you are premenopausal and you may become pregnant, ask your health care provider about preconception counseling.  If you may become pregnant, take 400 to 800 micrograms (mcg) of folic acid every day.  If you want to prevent pregnancy, talk to your health care provider about birth control (contraception). Osteoporosis and menopause  Osteoporosis is a disease in which the bones lose minerals and strength with aging. This can result in serious bone fractures. Your risk for osteoporosis can be identified using a bone density scan.  If you are 37 years of age or older, or if you are at risk for osteoporosis and fractures, ask your health care provider if you should be screened.  Ask your health care provider whether you should take a calcium or vitamin D supplement to lower your risk for osteoporosis.  Menopause may have certain physical symptoms and risks.  Hormone replacement therapy may reduce some of these symptoms and risks. Talk to your health care provider about whether hormone replacement therapy is right for you. Follow these instructions at home:  Schedule regular health, dental, and eye exams.  Stay current with your immunizations.  Do not use any tobacco products including cigarettes, chewing tobacco, or electronic cigarettes.  If you are pregnant, do not drink alcohol.  If you are breastfeeding, limit how much and how often you drink alcohol.  Limit  alcohol intake to no more than 1 drink per day for nonpregnant women. One drink equals 12 ounces of beer, 5 ounces of wine, or 1 ounces of hard liquor.  Do not use street drugs.  Do not share needles.  Ask your health care provider for help if you need support or information about quitting drugs.  Tell your health care provider if you often feel depressed.  Tell your health care provider if you have ever been abused or do not feel safe at home. This information is not intended to replace advice given to you by your health care provider. Make sure you discuss any questions you have with your health care provider. Document Released: 06/16/2011 Document Revised: 05/08/2016 Document Reviewed: 09/04/2015 Elsevier Interactive Patient Education  2017 [employer].

## 2017-04-23 LAB — LIPID PANEL
CHOLESTEROL TOTAL: 201 mg/dL — AB (ref 100–199)
Chol/HDL Ratio: 3 ratio (ref 0.0–4.4)
HDL: 66 mg/dL (ref >39)
LDL Calculated: 117 mg/dL — ABNORMAL HIGH (ref 0–99)
TRIGLYCERIDES: 88 mg/dL (ref 0–149)
VLDL Cholesterol Cal: 18 mg/dL (ref 5–40)

## 2017-04-23 LAB — GLUCOSE, RANDOM: Glucose: 87 mg/dL (ref 65–99)

## 2017-04-23 LAB — VITAMIN D 25 HYDROXY (VIT D DEFICIENCY, FRACTURES): Vit D, 25-Hydroxy: 31.1 ng/mL (ref 30.0–100.0)

## 2017-04-23 LAB — HEMOGLOBIN A1C
ESTIMATED AVERAGE GLUCOSE: 103 mg/dL
Hgb A1c MFr Bld: 5.2 % (ref 4.8–5.6)

## 2017-04-23 LAB — TSH: TSH: 2.62 u[IU]/mL (ref 0.450–4.500)

## 2017-04-25 LAB — PAP IG AND HPV HIGH-RISK
HPV, HIGH-RISK: NEGATIVE
PAP Smear Comment: 0

## 2017-11-24 ENCOUNTER — Other Ambulatory Visit: Payer: Self-pay

## 2017-11-24 MED ORDER — NORETHIN ACE-ETH ESTRAD-FE 1-20 MG-MCG PO TABS: 1.0000 | ORAL_TABLET | Freq: Every day | ORAL | 3 refills | Status: DC

## 2018-04-27 ENCOUNTER — Encounter: Payer: Managed Care, Other (non HMO) | Admitting: Obstetrics and Gynecology

## 2018-04-28 ENCOUNTER — Ambulatory Visit (INDEPENDENT_AMBULATORY_CARE_PROVIDER_SITE_OTHER): Payer: Managed Care, Other (non HMO) | Admitting: Obstetrics and Gynecology

## 2018-04-28 ENCOUNTER — Encounter: Payer: Self-pay | Admitting: Obstetrics and Gynecology

## 2018-04-28 VITALS — BP 86/58 | HR 85 | Ht 64.0 in | Wt 119.9 lb

## 2018-04-28 DIAGNOSIS — Z3041 Encounter for surveillance of contraceptive pills: Secondary | ICD-10-CM

## 2018-04-28 DIAGNOSIS — Z01419 Encounter for gynecological examination (general) (routine) without abnormal findings: Secondary | ICD-10-CM

## 2018-04-28 MED ORDER — NORETHIN ACE-ETH ESTRAD-FE 1-20 MG-MCG PO TABS: 1.0000 | ORAL_TABLET | Freq: Every day | ORAL | 3 refills | Status: DC

## 2018-04-28 NOTE — Patient Instructions (Signed)
1.  No Pap smear is done.  Next Pap smear is due in 2021 2.  Self breast awareness is encouraged.  Mammograms are to start at age 39 3.  Screening labs are ordered 4.  Continue with healthy eating and exercise. 5.  Birth control pills are refilled for 1 year 6.  Return in 1 year for annual exam  Health Maintenance, Female Adopting a healthy lifestyle and getting preventive care can go a long way to promote health and wellness. Talk with your health care provider about what schedule of regular examinations is right for you. This is a good chance for you to check in with your provider about disease prevention and staying healthy. In between checkups, there are plenty of things you can do on your own. Experts have done a lot of research about which lifestyle changes and preventive measures are most likely to keep you healthy. Ask your health care provider for more information. Weight and diet Eat a healthy diet  Be sure to include plenty of vegetables, fruits, low-fat dairy products, and lean protein.  Do not eat a lot of foods high in solid fats, added sugars, or salt.  Get regular exercise. This is one of the most important things you can do for your health. ? Most adults should exercise for at least 150 minutes each week. The exercise should increase your heart rate and make you sweat (moderate-intensity exercise). ? Most adults should also do strengthening exercises at least twice a week. This is in addition to the moderate-intensity exercise.  Maintain a healthy weight  Body mass index (BMI) is a measurement that can be used to identify possible weight problems. It estimates body fat based on height and weight. Your health care provider can help determine your BMI and help you achieve or maintain a healthy weight.  For females 77 years of age and older: ? A BMI below 18.5 is considered underweight. ? A BMI of 18.5 to 24.9 is normal. ? A BMI of 25 to 29.9 is considered overweight. ? A  BMI of 30 and above is considered obese.  Watch levels of cholesterol and blood lipids  You should start having your blood tested for lipids and cholesterol at 39 years of age, then have this test every 5 years.  You may need to have your cholesterol levels checked more often if: ? Your lipid or cholesterol levels are high. ? You are older than 39 years of age. ? You are at high risk for heart disease.  Cancer screening Lung Cancer  Lung cancer screening is recommended for adults 39-50 years old who are at high risk for lung cancer because of a history of smoking.  A yearly low-dose CT scan of the lungs is recommended for people who: ? Currently smoke. ? Have quit within the past 15 years. ? Have at least a 30-pack-year history of smoking. A pack year is smoking an average of one pack of cigarettes a day for 1 year.  Yearly screening should continue until it has been 15 years since you quit.  Yearly screening should stop if you develop a health problem that would prevent you from having lung cancer treatment.  Breast Cancer  Practice breast self-awareness. This means understanding how your breasts normally appear and feel.  It also means doing regular breast self-exams. Let your health care provider know about any changes, no matter how small.  If you are in your 20s or 30s, you should have a clinical breast  exam (CBE) by a health care provider every 1-3 years as part of a regular health exam.  If you are 55 or older, have a CBE every year. Also consider having a breast X-ray (mammogram) every year.  If you have a family history of breast cancer, talk to your health care provider about genetic screening.  If you are at high risk for breast cancer, talk to your health care provider about having an MRI and a mammogram every year.  Breast cancer gene (BRCA) assessment is recommended for women who have family members with BRCA-related cancers. BRCA-related cancers  include: ? Breast. ? Ovarian. ? Tubal. ? Peritoneal cancers.  Results of the assessment will determine the need for genetic counseling and BRCA1 and BRCA2 testing.  Cervical Cancer Your health care provider may recommend that you be screened regularly for cancer of the pelvic organs (ovaries, uterus, and vagina). This screening involves a pelvic examination, including checking for microscopic changes to the surface of your cervix (Pap test). You may be encouraged to have this screening done every 3 years, beginning at age 63.  For women ages 44-65, health care providers may recommend pelvic exams and Pap testing every 3 years, or they may recommend the Pap and pelvic exam, combined with testing for human papilloma virus (HPV), every 5 years. Some types of HPV increase your risk of cervical cancer. Testing for HPV may also be done on women of any age with unclear Pap test results.  Other health care providers may not recommend any screening for nonpregnant women who are considered low risk for pelvic cancer and who do not have symptoms. Ask your health care provider if a screening pelvic exam is right for you.  If you have had past treatment for cervical cancer or a condition that could lead to cancer, you need Pap tests and screening for cancer for at least 20 years after your treatment. If Pap tests have been discontinued, your risk factors (such as having a new sexual partner) need to be reassessed to determine if screening should resume. Some women have medical problems that increase the chance of getting cervical cancer. In these cases, your health care provider may recommend more frequent screening and Pap tests.  Colorectal Cancer  This type of cancer can be detected and often prevented.  Routine colorectal cancer screening usually begins at 39 years of age and continues through 39 years of age.  Your health care provider may recommend screening at an earlier age if you have risk factors  for colon cancer.  Your health care provider may also recommend using home test kits to check for hidden blood in the stool.  A small camera at the end of a tube can be used to examine your colon directly (sigmoidoscopy or colonoscopy). This is done to check for the earliest forms of colorectal cancer.  Routine screening usually begins at age 15.  Direct examination of the colon should be repeated every 5-10 years through 39 years of age. However, you may need to be screened more often if early forms of precancerous polyps or small growths are found.  Skin Cancer  Check your skin from head to toe regularly.  Tell your health care provider about any new moles or changes in moles, especially if there is a change in a mole's shape or color.  Also tell your health care provider if you have a mole that is larger than the size of a pencil eraser.  Always use sunscreen. Apply sunscreen  liberally and repeatedly throughout the day.  Protect yourself by wearing long sleeves, pants, a wide-brimmed hat, and sunglasses whenever you are outside.  Heart disease, diabetes, and high blood pressure  High blood pressure causes heart disease and increases the risk of stroke. High blood pressure is more likely to develop in: ? People who have blood pressure in the high end of the normal range (130-139/85-89 mm Hg). ? People who are overweight or obese. ? People who are African American.  If you are 32-98 years of age, have your blood pressure checked every 3-5 years. If you are 39 years of age or older, have your blood pressure checked every year. You should have your blood pressure measured twice-once when you are at a hospital or clinic, and once when you are not at a hospital or clinic. Record the average of the two measurements. To check your blood pressure when you are not at a hospital or clinic, you can use: ? An automated blood pressure machine at a pharmacy. ? A home blood pressure monitor.  If  you are between 37 years and 72 years old, ask your health care provider if you should take aspirin to prevent strokes.  Have regular diabetes screenings. This involves taking a blood sample to check your fasting blood sugar level. ? If you are at a normal weight and have a low risk for diabetes, have this test once every three years after 39 years of age. ? If you are overweight and have a high risk for diabetes, consider being tested at a younger age or more often. Preventing infection Hepatitis B  If you have a higher risk for hepatitis B, you should be screened for this virus. You are considered at high risk for hepatitis B if: ? You were born in a country where hepatitis B is common. Ask your health care provider which countries are considered high risk. ? Your parents were born in a high-risk country, and you have not been immunized against hepatitis B (hepatitis B vaccine). ? You have HIV or AIDS. ? You use needles to inject street drugs. ? You live with someone who has hepatitis B. ? You have had sex with someone who has hepatitis B. ? You get hemodialysis treatment. ? You take certain medicines for conditions, including cancer, organ transplantation, and autoimmune conditions.  Hepatitis C  Blood testing is recommended for: ? Everyone born from [birth year] through 1967. ? Anyone with known risk factors for hepatitis C.  Sexually transmitted infections (STIs)  You should be screened for sexually transmitted infections (STIs) including gonorrhea and chlamydia if: ? You are sexually active and are younger than 39 years of age. ? You are older than 39 years of age and your health care provider tells you that you are at risk for this type of infection. ? Your sexual activity has changed since you were last screened and you are at an increased risk for chlamydia or gonorrhea. Ask your health care provider if you are at risk.  If you do not have HIV, but are at risk, it may be recommended  that you take a prescription medicine daily to prevent HIV infection. This is called pre-exposure prophylaxis (PrEP). You are considered at risk if: ? You are sexually active and do not regularly use condoms or know the HIV status of your partner(s). ? You take drugs by injection. ? You are sexually active with a partner who has HIV.  Talk with your health care provider about  whether you are at high risk of being infected with HIV. If you choose to begin PrEP, you should first be tested for HIV. You should then be tested every 3 months for as long as you are taking PrEP. Pregnancy  If you are premenopausal and you may become pregnant, ask your health care provider about preconception counseling.  If you may become pregnant, take 400 to 800 micrograms (mcg) of folic acid every day.  If you want to prevent pregnancy, talk to your health care provider about birth control (contraception). Osteoporosis and menopause  Osteoporosis is a disease in which the bones lose minerals and strength with aging. This can result in serious bone fractures. Your risk for osteoporosis can be identified using a bone density scan.  If you are 43 years of age or older, or if you are at risk for osteoporosis and fractures, ask your health care provider if you should be screened.  Ask your health care provider whether you should take a calcium or vitamin D supplement to lower your risk for osteoporosis.  Menopause may have certain physical symptoms and risks.  Hormone replacement therapy may reduce some of these symptoms and risks. Talk to your health care provider about whether hormone replacement therapy is right for you. Follow these instructions at home:  Schedule regular health, dental, and eye exams.  Stay current with your immunizations.  Do not use any tobacco products including cigarettes, chewing tobacco, or electronic cigarettes.  If you are pregnant, do not drink alcohol.  If you are  breastfeeding, limit how much and how often you drink alcohol.  Limit alcohol intake to no more than 1 drink per day for nonpregnant women. One drink equals 12 ounces of beer, 5 ounces of wine, or 1 ounces of hard liquor.  Do not use street drugs.  Do not share needles.  Ask your health care provider for help if you need support or information about quitting drugs.  Tell your health care provider if you often feel depressed.  Tell your health care provider if you have ever been abused or do not feel safe at home. This information is not intended to replace advice given to you by your health care provider. Make sure you discuss any questions you have with your health care provider. Document Released: 06/16/2011 Document Revised: 05/08/2016 Document Reviewed: 09/04/2015 Elsevier Interactive Patient Education  [employer].

## 2018-04-28 NOTE — Progress Notes (Signed)
Patient ID: Lindsay Santos, female   DOB: Jul 15, 1981, 37 y.o.   MRN: [id number] ANNUAL PREVENTATIVE CARE GYN  ENCOUNTER NOTE  Subjective:       Lindsay Santos is a 37 y.o. [phone] female here for a routine annual gynecologic exam.  Current complaints: 1.  None  Cycles are regular on OCPs without breakthrough bleeding Bowel function normal Bladder function normal  No major interval health problems have been identified. .   Gynecologic History Patient's last menstrual period was 02/15/2018 (exact date).  Pap- 04/2017 neg/neg Contraception: OCP (estrogen/progesterone)  Last mammogram: n/a. Results were: n/a  Obstetric History OB History  Gravida Para Term Preterm AB Living  SAB TAB Ectopic Multiple Live Births          2    # Outcome Date GA Lbr Len/2nd Weight Sex Delivery Anes PTL Lv  2 Term      Vag-Spont   LIV  1 Term      VBAC   LIV    Past Medical History:  Diagnosis Date  . Acne   . Grief   . Hydronephrosis   . Panic anxiety syndrome     Past Surgical History:  Procedure Laterality Date  . ADENOIDECTOMY    . KIDNEY SURGERY     rt kidney stent and removal    Current Outpatient Medications on File Prior to Visit  Medication Sig Dispense Refill  . norethindrone-ethinyl estradiol (JUNEL FE 1/20) 1-20 MG-MCG tablet Take 1 tablet by mouth daily. 84 tablet 3  . spironolactone (ALDACTONE) 50 MG tablet Take 50 mg by mouth daily.     No current facility-administered medications on file prior to visit.     No Known Allergies  Social History   Socioeconomic History  . Marital status: Married    Spouse name: Not on file  . Number of children: Not on file  . Years of education: Not on file  . Highest education level: Not on file  Occupational History  . Not on file  Social Needs  . Financial resource strain: Not on file  . Food insecurity:    Worry: Not on file    Inability: Not on file  . Transportation needs:    Medical: Not on file    Non-medical: Not on file  Tobacco Use  . Smoking status: Never Smoker  . Smokeless tobacco: Never Used  Substance and Sexual Activity  . Alcohol use: Yes    Comment: moderate alcohol - glass of wine at nite  . Drug use: No  . Sexual activity: Yes    Birth control/protection: Pill  Lifestyle  . Physical activity:    Days per week: Not on file    Minutes per session: Not on file  . Stress: Not on file  Relationships  . Social connections:    Talks on phone: Not on file    Gets together: Not on file    Attends religious service: Not on file    Active member of club or organization: Not on file    Attends meetings of clubs or organizations: Not on file    Relationship status: Not on file  . Intimate partner violence:    Fear of current or ex partner: Not on file    Emotionally abused: Not on file    Physically abused: Not on file    Forced sexual activity: Not on file  Other Topics Concern  . Not on  file  Social History Narrative  . Not on file    Family History  Problem Relation Age of Onset  . Cancer Neg Hx   . Diabetes Neg Hx   . Heart disease Neg Hx     The following portions of the patient's history were reviewed and updated as appropriate: allergies, current medications, past family history, past medical history, past social history, past surgical history and problem list.  Review of Systems  Review of Systems  Constitutional: Negative.   HENT: Negative.   Eyes: Negative.   Respiratory: Negative.   Cardiovascular: Negative.   Gastrointestinal: Negative.   Genitourinary: Negative.   Musculoskeletal: Negative.   Skin: Negative.   Neurological: Negative.   Endo/Heme/Allergies: Negative.   Psychiatric/Behavioral: Negative.      Objective:   BP (!) 86/58   Pulse 85   Ht  (1.626 m)   Wt 119 lb 14.4 oz (54.4 kg)   LMP 02/15/2018 (Exact Date)   BMI 20.58 kg/m CONSTITUTIONAL: Well-developed, well-nourished female in no acute distress.  PSYCHIATRIC:  Normal mood and affect. Normal behavior. Normal judgment and thought content. NEUROLGIC: Alert and oriented to person, place, and time. Normal muscle tone coordination. No cranial nerve deficit noted. HENT:  Normocephalic, atraumatic, External right and left ear normal.  EYES: Conjunctivae and EOM are normal.  No scleral icterus.  NECK: Normal range of motion, supple, no masses.  Normal thyroid.  SKIN: Skin is warm and dry. No rash noted. Not diaphoretic. No erythema. No pallor. CARDIOVASCULAR: Normal heart rate noted, regular rhythm, no murmur. RESPIRATORY: Clear to auscultation bilaterally. Effort and breath sounds normal, no problems with respiration noted. BREASTS: Symmetric in size. No masses, skin changes, nipple drainage, or lymphadenopathy. ABDOMEN: Soft, normal bowel sounds, no distention noted.  No tenderness, rebound or guarding.  BLADDER: Normal PELVIC:  External Genitalia: Normal  BUS: Normal  Vagina: Normal  Cervix: Normal; no lesions; no cervical motion tenderness  Uterus: Normal; midplane to anteverted, small, mobile, nontender  Adnexa: Normal; nonpalpable nontender  RV: External Exam NormaI  MUSCULOSKELETAL: Normal range of motion. No tenderness.  No cyanosis, clubbing, or edema.  2+ distal pulses. LYMPHATIC: No Axillary, Supraclavicular, or Inguinal Adenopathy.    Assessment:   Annual gynecologic examination 37 y.o. Contraception: OCP (estrogen/progesterone) bmi-20  history of panic/anxiety, stable  Plan:  Pap: due 2021 Mammogram: Not Indicated Stool Guaiac Testing:  Not Indicated Labs:lipid tsh fbs a1c vit d Routine preventative health maintenance measures emphasized: Exercise/Diet/Weight control, Tobacco Warnings, Alcohol/Substance use risks and Stress Management OCPs refilled Return to Clinic - 1 Year  Crystal Dowagiac, CMA  Herold Harms, MD    Note: This dictation was prepared with Dragon dictation along with smaller phrase technology. Any  transcriptional errors that result from this process are unintentional.

## 2018-04-29 LAB — VITAMIN D 25 HYDROXY (VIT D DEFICIENCY, FRACTURES): Vit D, 25-Hydroxy: 30.8 ng/mL (ref 30.0–100.0)

## 2018-04-29 LAB — HEMOGLOBIN A1C
Est. average glucose Bld gHb Est-mCnc: 100 mg/dL
HEMOGLOBIN A1C: 5.1 % (ref 4.8–5.6)

## 2018-04-29 LAB — LIPID PANEL
CHOL/HDL RATIO: 2.8 ratio (ref 0.0–4.4)
Cholesterol, Total: 192 mg/dL (ref 100–199)
HDL: 68 mg/dL (ref >39)
LDL CALC: 102 mg/dL — AB (ref 0–99)
TRIGLYCERIDES: 112 mg/dL (ref 0–149)
VLDL Cholesterol Cal: 22 mg/dL (ref 5–40)

## 2018-04-29 LAB — TSH: TSH: 2.57 u[IU]/mL (ref 0.450–4.500)

## 2018-04-29 LAB — GLUCOSE, RANDOM: GLUCOSE: 77 mg/dL (ref 65–99)

## 2018-07-13 ENCOUNTER — Telehealth: Payer: Self-pay | Admitting: Obstetrics and Gynecology

## 2018-07-13 NOTE — Telephone Encounter (Signed)
Patient called stating she used to only have 4 periods a year with her current birth control. That was with her picking up 3 packs at a time. She states that she has been having a period every month so she doesn't know what is different. She would like a call back. Thanks

## 2018-07-14 ENCOUNTER — Other Ambulatory Visit: Payer: Self-pay

## 2018-07-14 MED ORDER — NORETHIN ACE-ETH ESTRAD-FE 1-20 MG-MCG PO TABS: 1.0000 | ORAL_TABLET | Freq: Every day | ORAL | 3 refills | Status: DC

## 2018-07-14 NOTE — Telephone Encounter (Signed)
Pt needs new rx sent in to say take pills continuously. Pt aware new rx erx.

## 2018-08-02 ENCOUNTER — Telehealth: Payer: Self-pay | Admitting: Obstetrics and Gynecology

## 2018-08-02 NOTE — Telephone Encounter (Signed)
/  Pt and pharmacy aware rx erx correctly on 07/14/18. Tiffany at [facility] will reach out to pts Insurance. Pt to call me back if unable to get rx.

## 2018-08-02 NOTE — Telephone Encounter (Signed)
Patient called stating the only way her insurance will pay for a 3 month supply of birth control is if it states specifically that she has to take it continuously for 3 months skipping the placebos. Thanks

## 2018-08-20 ENCOUNTER — Telehealth: Payer: Self-pay | Admitting: Obstetrics and Gynecology

## 2018-08-20 MED ORDER — NORETHIN ACE-ETH ESTRAD-FE 1-20 MG-MCG PO TABS
1.0000 | ORAL_TABLET | Freq: Every day | ORAL | 3 refills | Status: DC
Start: 2018-08-20 — End: 2019-10-14

## 2018-08-20 NOTE — Telephone Encounter (Signed)
The patient called and stated that she needs to speak with Crystal her nurse in regards to a medication issue. Please advise.

## 2018-08-20 NOTE — Telephone Encounter (Signed)
Per pt insurance. Pt needs 4 months at a time. OCP erx Contacted pharmacy they have rx.

## 2019-05-03 ENCOUNTER — Encounter: Payer: Managed Care, Other (non HMO) | Admitting: Obstetrics and Gynecology

## 2019-07-08 ENCOUNTER — Ambulatory Visit (INDEPENDENT_AMBULATORY_CARE_PROVIDER_SITE_OTHER): Payer: Managed Care, Other (non HMO) | Admitting: Obstetrics and Gynecology

## 2019-07-08 ENCOUNTER — Other Ambulatory Visit: Payer: Self-pay

## 2019-07-08 ENCOUNTER — Encounter: Payer: Self-pay | Admitting: Obstetrics and Gynecology

## 2019-07-08 VITALS — BP 93/61 | HR 61 | Ht 64.0 in | Wt 121.2 lb

## 2019-07-08 DIAGNOSIS — Z1321 Encounter for screening for nutritional disorder: Secondary | ICD-10-CM

## 2019-07-08 DIAGNOSIS — Z1231 Encounter for screening mammogram for malignant neoplasm of breast: Secondary | ICD-10-CM

## 2019-07-08 DIAGNOSIS — Z01419 Encounter for gynecological examination (general) (routine) without abnormal findings: Secondary | ICD-10-CM | POA: Diagnosis not present

## 2019-07-08 DIAGNOSIS — Z13220 Encounter for screening for lipoid disorders: Secondary | ICD-10-CM

## 2019-07-08 DIAGNOSIS — Z131 Encounter for screening for diabetes mellitus: Secondary | ICD-10-CM

## 2019-07-08 NOTE — Patient Instructions (Signed)
Preventive Care 40-40 Years Old, Female Preventive care refers to visits with your health care provider and lifestyle choices that can promote health and wellness. This includes:  A yearly physical exam. This may also be called an annual well check.  Regular dental visits and eye exams.  Immunizations.  Screening for certain conditions.  Healthy lifestyle choices, such as eating a healthy diet, getting regular exercise, not using drugs or products that contain nicotine and tobacco, and limiting alcohol use. What can I expect for my preventive care visit? Physical exam Your health care provider will check your:  Height and weight. This may be used to calculate body mass index (BMI), which tells if you are at a healthy weight.  Heart rate and blood pressure.  Skin for abnormal spots. Counseling Your health care provider may ask you questions about your:  Alcohol, tobacco, and drug use.  Emotional well-being.  Home and relationship well-being.  Sexual activity.  Eating habits.  Work and work environment.  Method of birth control.  Menstrual cycle.  Pregnancy history. What immunizations do I need?  Influenza (flu) vaccine  This is recommended every year. Tetanus, diphtheria, and pertussis (Tdap) vaccine  You may need a Td booster every 10 years. Varicella (chickenpox) vaccine  You may need this if you have not been vaccinated. Zoster (shingles) vaccine  You may need this after age 60. Measles, mumps, and rubella (MMR) vaccine  You may need at least one dose of MMR if you were born in 1957 or later. You may also need a second dose. Pneumococcal conjugate (PCV13) vaccine  You may need this if you have certain conditions and were not previously vaccinated. Pneumococcal polysaccharide (PPSV23) vaccine  You may need one or two doses if you smoke cigarettes or if you have certain conditions. Meningococcal conjugate (MenACWY) vaccine  You may need this if you  have certain conditions. Hepatitis A vaccine  You may need this if you have certain conditions or if you travel or work in places where you may be exposed to hepatitis A. Hepatitis B vaccine  You may need this if you have certain conditions or if you travel or work in places where you may be exposed to hepatitis B. Haemophilus influenzae type b (Hib) vaccine  You may need this if you have certain conditions. Human papillomavirus (HPV) vaccine  If recommended by your health care provider, you may need three doses over 6 months. You may receive vaccines as individual doses or as more than one vaccine together in one shot (combination vaccines). Talk with your health care provider about the risks and benefits of combination vaccines. What tests do I need? Blood tests  Lipid and cholesterol levels. These may be checked every 5 years, or more frequently if you are over 50 years old.  Hepatitis C test.  Hepatitis B test. Screening  Lung cancer screening. You may have this screening every year starting at age 55 if you have a 30-pack-year history of smoking and currently smoke or have quit within the past 15 years.  Colorectal cancer screening. All adults should have this screening starting at age 50 and continuing until age 75. Your health care provider may recommend screening at age 45 if you are at increased risk. You will have tests every 1-10 years, depending on your results and the type of screening test.  Diabetes screening. This is done by checking your blood sugar (glucose) after you have not eaten for a while (fasting). You may have this   done every 1-3 years.  Mammogram. This may be done every 1-2 years. Talk with your health care provider about when you should start having regular mammograms. This may depend on whether you have a family history of breast cancer.  BRCA-related cancer screening. This may be done if you have a family history of breast, ovarian, tubal, or peritoneal  cancers.  Pelvic exam and Pap test. This may be done every 3 years starting at age 23. Starting at age 86, this may be done every 5 years if you have a Pap test in combination with an HPV test. Other tests  Sexually transmitted disease (STD) testing.  Bone density scan. This is done to screen for osteoporosis. You may have this scan if you are at high risk for osteoporosis. Follow these instructions at home: Eating and drinking  Eat a diet that includes fresh fruits and vegetables, whole grains, lean protein, and low-fat dairy.  Take vitamin and mineral supplements as recommended by your health care provider.  Do not drink alcohol if: ? Your health care provider tells you not to drink. ? You are pregnant, may be pregnant, or are planning to become pregnant.  If you drink alcohol: ? Limit how much you have to 0-1 drink a day. ? Be aware of how much alcohol is in your drink. In the [place] one drink equals one 12 oz bottle of beer (355 mL), one 5 oz glass of wine (148 mL), or one 1 oz glass of hard liquor (44 mL). Lifestyle  Take daily care of your teeth and gums.  Stay active. Exercise for at least 30 minutes on 5 or more days each week.  Do not use any products that contain nicotine or tobacco, such as cigarettes, e-cigarettes, and chewing tobacco. If you need help quitting, ask your health care provider.  If you are sexually active, practice safe sex. Use a condom or other form of birth control (contraception) in order to prevent pregnancy and STIs (sexually transmitted infections).  If told by your health care provider, take low-dose aspirin daily starting at age 27. What's next?  Visit your health care provider once a year for a well check visit.  Ask your health care provider how often you should have your eyes and teeth checked.  Stay up to date on all vaccines. This information is not intended to replace advice given to you by your health care provider. Make sure you  discuss any questions you have with your health care provider. Document Released: 12/28/2015 Document Revised: 08/12/2018 Document Reviewed: 08/12/2018 Elsevier Patient Education  2020 [place] self-awareness is knowing how your breasts look and feel. Doing breast self-awareness is important. It allows you to catch a breast problem early while it is still small and can be treated. All women should do breast self-awareness, including women who have had breast implants. Tell your doctor if you notice a change in your breasts. What you need:  A mirror.  A well-lit room. How to do a breast self-exam A breast self-exam is one way to learn what is normal for your breasts and to check for changes. To do a breast self-exam: Look for changes  1. Take off all the clothes above your waist. 2. Stand in front of a mirror in a room with good lighting. 3. Put your hands on your hips. 4. Push your hands down. 5. Look at your breasts and nipples in the mirror to see if one breast or nipple looks  different from the other. Check to see if: ? The shape of one breast is different. ? The size of one breast is different. ? There are wrinkles, dips, and bumps in one breast and not the other. 6. Look at each breast for changes in the skin, such as: ? Redness. ? Scaly areas. 7. Look for changes in your nipples, such as: ? Liquid around the nipples. ? Bleeding. ? Dimpling. ? Redness. ? A change in where the nipples are. Feel for changes  1. Lie on your back on the floor. 2. Feel each breast. To do this, follow these steps: ? Pick a breast to feel. ? Put the arm closest to that breast above your head. ? Use your other arm to feel the nipple area of your breast. Feel the area with the pads of your three middle fingers by making small circles with your fingers. For the first circle, press lightly. For the second circle, press harder. For the third circle, press even harder.  ? Keep making circles with your fingers at the different pressures as you move down your breast. Stop when you feel your ribs. ? Move your fingers a little toward the center of your body. ? Start making circles with your fingers again, this time going up until you reach your collarbone. ? Keep making up-and-down circles until you reach your armpit. Remember to keep using the three pressures. ? Feel the other breast in the same way. 3. Sit or stand in the tub or shower. 4. With soapy water on your skin, feel each breast the same way you did in step 2 when you were lying on the floor. Write down what you find Writing down what you find can help you remember what to tell your doctor. Write down:  What is normal for each breast.  Any changes you find in each breast, including: ? The kind of changes you find. ? Whether you have pain. ? Size and location of any lumps.  When you last had your menstrual period. General tips  Check your breasts every month.  If you are breastfeeding, the best time to check your breasts is after you feed your baby or after you use a breast pump.  If you get menstrual periods, the best time to check your breasts is 5-7 days after your menstrual period is over.  With time, you will become comfortable with the self-exam, and you will begin to know if there are changes in your breasts. Contact a doctor if you:  See a change in the shape or size of your breasts or nipples.  See a change in the skin of your breast or nipples, such as red or scaly skin.  Have fluid coming from your nipples that is not normal.  Find a lump or thick area that was not there before.  Have pain in your breasts.  Have any concerns about your breast health. Summary  Breast self-awareness includes looking for changes in your breasts, as well as feeling for changes within your breasts.  Breast self-awareness should be done in front of a mirror in a well-lit room.  You should  check your breasts every month. If you get menstrual periods, the best time to check your breasts is 5-7 days after your menstrual period is over.  Let your doctor know of any changes you see in your breasts, including changes in size, changes on the skin, pain or tenderness, or fluid from your nipples that is not normal. This  information is not intended to replace advice given to you by your health care provider. Make sure you discuss any questions you have with your health care provider. Document Released: 05/19/2008 Document Revised: 07/20/2018 Document Reviewed: 07/20/2018 Elsevier Patient Education  2020 [employer].

## 2019-07-08 NOTE — Progress Notes (Signed)
ANNUAL PREVENTATIVE CARE GYNECOLOGY  ENCOUNTER NOTE  Subjective:       Lindsay Santos is a 38 y.o. G62P2002 female here for a routine annual gynecologic exam. She has a PMH of  Past Medical History:  Diagnosis Date  . Acne   . Grief   . Hydronephrosis   . Panic anxiety syndrome     She has stayed healthy throughout Covid- has been able to work from home. She tries to get regular exercise and eats healthy diet- walking/running quite a bit lately.  Still using the [place] for birth control. Currently has period every 3 months, which she likes. Occasional breakthrough bleeding but not bad. Experiencing some hormonal bumps/ melasma on her cheeks, unchanged. Not really interested in devices. She is finished having children, but isn't interested in tubes tied. No PMH of blood clots, smoking.  Patient does not have a PCP, she prefers to have her labs done here. She is a [employer] employee     Gynecologic History Patient's last menstrual period was 03/28/2019. Contraception: Junel OCP Last Pap with HPV: 04/2017, normal. Last mammogram: none on file   Obstetric History OB History  Gravida Para Term Preterm AB Living  2 2 2     2   SAB TAB Ectopic Multiple Live Births          2    # Outcome Date GA Lbr Len/2nd Weight Sex Delivery Anes PTL Lv  2 Term      Vag-Spont   LIV  1 Term      VBAC   LIV    Past Medical History:  Diagnosis Date  . Acne   . Grief   . Hydronephrosis   . Panic anxiety syndrome     Family History  Problem Relation Age of Onset  . Cancer Neg Hx   . Diabetes Neg Hx   . Heart disease Neg Hx     Past Surgical History:  Procedure Laterality Date  . ADENOIDECTOMY    . KIDNEY SURGERY     rt kidney stent and removal    Social History   Socioeconomic History  . Marital status: Married    Spouse name: Not on file  . Number of children: Not on file  . Years of education: Not on file  . Highest education level: Not on file  Occupational History  .  Not on file  Social Needs  . Financial resource strain: Not on file  . Food insecurity    Worry: Not on file    Inability: Not on file  . Transportation needs    Medical: Not on file    Non-medical: Not on file  Tobacco Use  . Smoking status: Never Smoker  . Smokeless tobacco: Never Used  Substance and Sexual Activity  . Alcohol use: Yes    Comment: moderate alcohol - glass of wine at nite  . Drug use: No  . Sexual activity: Yes    Birth control/protection: Pill  Lifestyle  . Physical activity    Days per week: 1 day    Minutes per session: 30 min  . Stress: Not on file  Relationships  . Social Herbalist on phone: Not on file    Gets together: Not on file    Attends religious service: Not on file    Active member of club or organization: Not on file    Attends meetings of clubs or organizations: Not on file    Relationship status:  Not on file  . Intimate partner violence    Fear of current or ex partner: Not on file    Emotionally abused: Not on file    Physically abused: Not on file    Forced sexual activity: Not on file  Other Topics Concern  . Not on file  Social History Narrative  . Not on file    Current Outpatient Medications on File Prior to Visit  Medication Sig Dispense Refill  . norethindrone-ethinyl estradiol (JUNEL FE 1/20) 1-20 MG-MCG tablet Take 1 tablet by mouth daily. Take continuously. Skipping placebo pills. 112 tablet 3  . spironolactone (ALDACTONE) 50 MG tablet Take 50 mg by mouth daily.     No current facility-administered medications on file prior to visit.     No Known Allergies    Review of Systems ROS Review of Systems - General ROS: negative for - chills, fatigue, fever, hot flashes, night sweats, weight gain or weight loss Psychological ROS: negative for - anxiety, decreased libido, depression, mood swings, physical abuse or sexual abuse Ophthalmic ROS: negative for - blurry vision, eye pain or loss of vision ENT ROS:  negative for - headaches, hearing change, visual changes or vocal changes Allergy and Immunology ROS: negative for - hives, itchy/watery eyes or seasonal allergies Hematological and Lymphatic ROS: negative for - bleeding problems, bruising, swollen lymph nodes or weight loss Endocrine ROS: negative for - galactorrhea, hair pattern changes, hot flashes, malaise/lethargy, mood swings, palpitations, polydipsia/polyuria, skin changes, temperature intolerance or unexpected weight changes Breast ROS: negative for - new or changing breast lumps or nipple discharge Respiratory ROS: negative for - cough or shortness of breath Cardiovascular ROS: negative for - chest pain, irregular heartbeat, palpitations or shortness of breath Gastrointestinal ROS: no abdominal pain, change in bowel habits, or black or bloody stools Genito-Urinary ROS: no dysuria, trouble voiding, or hematuria Musculoskeletal ROS: negative for - joint pain or joint stiffness Neurological ROS: negative for - bowel and bladder control changes Dermatological ROS: negative for rash and skin lesion changes   Objective:   General Appearance:    Alert, cooperative, no distress, appears stated age  Head:    Normocephalic, without obvious abnormality, atraumatic  Eyes:    PERRL, conjunctiva/corneas clear, EOM's intact, both eyes  Ears:    Normal external ear canals, both ears  Nose:   Nares normal, septum midline, mucosa normal, no drainage or sinus tenderness  Throat:   Lips, mucosa, and tongue normal; teeth and gums normal  Neck:   Supple, symmetrical, trachea midline, no adenopathy; thyroid: no enlargement/tenderness/nodules; no carotid bruit or JVD  Back:     Symmetric, no curvature, ROM normal, no CVA tenderness  Lungs:     Clear to auscultation bilaterally, respirations unlabored  Chest Wall:    No tenderness or deformity   Heart:    Regular rate and rhythm, S1 and S2 normal, no murmur, rub or gallop  Breast Exam:    No tenderness,  masses, or nipple abnormality  Abdomen:     Soft, non-tender, bowel sounds active all four quadrants, no masses, no organomegaly.    Genitalia:    Pelvic:external genitalia normal, vagina without lesions, or tenderness, rectovaginal septum  normal. Cervix with ectropion present, no cervical motion tenderness, no adnexal masses or tenderness.  Uterus normal size, shape, mobile, regular contours, nontender.  Rectal:    Normal external sphincter.  No hemorrhoids appreciated. Internal exam not done.   Extremities:   Extremities normal, atraumatic, no cyanosis or edema  Pulses:   2+ and symmetric all extremities  Skin:   Skin color, texture, turgor normal, no rashes or lesions  Lymph nodes:   Cervical, supraclavicular, and axillary nodes normal  Neurologic:   CNII-XII intact, normal strength, sensation and reflexes throughout     Labs: Lab Results  Component Value Date   WBC 6.6 04/01/2016   HGB 15.5 04/01/2016   HCT 46.6 04/01/2016   MCV 92 04/01/2016   PLT 255 04/01/2016    Lab Results  Component Value Date   CREATININE 0.88 04/20/2013   BUN 14 04/20/2013   NA 137 04/20/2013   K 4.5 04/20/2013   CL 104 04/20/2013   CO2 27 04/20/2013    Lab Results  Component Value Date   ALT 29 04/20/2013   AST 22 04/20/2013   ALKPHOS 62 04/20/2013   BILITOT 0.3 04/20/2013    Lab Results  Component Value Date   CHOL 192 04/28/2018   HDL 68 04/28/2018   LDLCALC 102 (H) 04/28/2018   TRIG 112 04/28/2018   CHOLHDL 2.8 04/28/2018    Lab Results  Component Value Date   TSH 2.570 04/28/2018    Lab Results  Component Value Date   HGBA1C 5.1 04/28/2018     Assessment & Plan   Annual gynecologic examination 38 y.o.  Problem List Items Addressed This Visit    None    Visit Diagnoses    Encounter for well woman exam with routine gynecological exam    -  Primary   Lipid screening       Breast cancer screening by mammogram         Labs, as above: vitamin D, TSH, lipid panel,  A1c, CBC, glucose.  For contraceptive counseling, discussed different varieties of OCP. Pt doing well on Junel, she prefers to continue this. She still has refills left. Pap up to date. Mammogram: ordered, pt will schedule Discussed healthy lifestyle interventions.   Return to Clinic - 1 Year or as needed   Ignacia BayleyLaura Graham, PA-S 07/08/19     I have seen and examined the patient with Ignacia BayleyLaura Graham, [place] PA-S.  I have reviewed the record and concur with patient management and plan as documented.   Hildred Laserherry, Javad Salva, MD [facility]

## 2019-07-08 NOTE — Progress Notes (Signed)
Pt is present today for annual exam. Pt stated that she would like to get more information about birth control due to pt wishes to not have her monthly menstrual anymore.

## 2019-07-29 NOTE — Addendum Note (Signed)
Addended by: Edwyna Shell on: 07/29/2019 10:43 AM   Modules accepted: Orders

## 2019-07-30 LAB — LIPID PANEL
Chol/HDL Ratio: 2.6 ratio (ref 0.0–4.4)
Cholesterol, Total: 206 mg/dL — ABNORMAL HIGH (ref 100–199)
HDL: 78 mg/dL (ref >39)
LDL Calculated: 110 mg/dL — ABNORMAL HIGH (ref 0–99)
Triglycerides: 89 mg/dL (ref 0–149)
VLDL Cholesterol Cal: 18 mg/dL (ref 5–40)

## 2019-07-30 LAB — TSH: TSH: 3.77 u[IU]/mL (ref 0.450–4.500)

## 2019-07-30 LAB — CBC
Hematocrit: 51.8 % — ABNORMAL HIGH (ref 34.0–46.6)
Hemoglobin: 16.8 g/dL — ABNORMAL HIGH (ref 11.1–15.9)
MCH: 30.6 pg (ref 26.6–33.0)
MCHC: 32.4 g/dL (ref 31.5–35.7)
MCV: 94 fL (ref 79–97)
Platelets: 283 10*3/uL (ref 150–450)
RBC: 5.49 x10E6/uL — ABNORMAL HIGH (ref 3.77–5.28)
RDW: 12.1 % (ref 11.7–15.4)
WBC: 6.8 10*3/uL (ref 3.4–10.8)

## 2019-07-30 LAB — GLUCOSE, RANDOM: Glucose: 83 mg/dL (ref 65–99)

## 2019-07-30 LAB — VITAMIN D 25 HYDROXY (VIT D DEFICIENCY, FRACTURES): Vit D, 25-Hydroxy: 40.4 ng/mL (ref 30.0–100.0)

## 2019-07-30 LAB — HEMOGLOBIN A1C
Est. average glucose Bld gHb Est-mCnc: 100 mg/dL
Hgb A1c MFr Bld: 5.1 % (ref 4.8–5.6)

## 2019-10-14 ENCOUNTER — Telehealth: Payer: Self-pay

## 2019-10-14 MED ORDER — NORETHIN ACE-ETH ESTRAD-FE 1-20 MG-MCG PO TABS: 1.0000 | ORAL_TABLET | Freq: Every day | ORAL | 4 refills | Status: DC

## 2019-10-14 NOTE — Telephone Encounter (Signed)
Pt called for refill JUNELL 4 pack, written so pt can skip placebos.  [facility] [address] [place].  Dr. Marcelline Mates.

## 2019-10-14 NOTE — Telephone Encounter (Signed)
Pt called and informed that her medication has been refilled.  

## 2020-07-10 NOTE — Patient Instructions (Addendum)
Preventive Care 40-41 Years Old, Female Preventive care refers to visits with your health care provider and lifestyle choices that can promote health and wellness. This includes:  A yearly physical exam. This may also be called an annual well check.  Regular dental visits and eye exams.  Immunizations.  Screening for certain conditions.  Healthy lifestyle choices, such as eating a healthy diet, getting regular exercise, not using drugs or products that contain nicotine and tobacco, and limiting alcohol use. What can I expect for my preventive care visit? Physical exam Your health care provider will check your:  Height and weight. This may be used to calculate body mass index (BMI), which tells if you are at a healthy weight.  Heart rate and blood pressure.  Skin for abnormal spots. Counseling Your health care provider may ask you questions about your:  Alcohol, tobacco, and drug use.  Emotional well-being.  Home and relationship well-being.  Sexual activity.  Eating habits.  Work and work environment.  Method of birth control.  Menstrual cycle.  Pregnancy history. What immunizations do I need?  Influenza (flu) vaccine  This is recommended every year. Tetanus, diphtheria, and pertussis (Tdap) vaccine  You may need a Td booster every 10 years. Varicella (chickenpox) vaccine  You may need this if you have not been vaccinated. Zoster (shingles) vaccine  You may need this after age 60. Measles, mumps, and rubella (MMR) vaccine  You may need at least one dose of MMR if you were born in 1957 or later. You may also need a second dose. Pneumococcal conjugate (PCV13) vaccine  You may need this if you have certain conditions and were not previously vaccinated. Pneumococcal polysaccharide (PPSV23) vaccine  You may need one or two doses if you smoke cigarettes or if you have certain conditions. Meningococcal conjugate (MenACWY) vaccine  You may need this if you  have certain conditions. Hepatitis A vaccine  You may need this if you have certain conditions or if you travel or work in places where you may be exposed to hepatitis A. Hepatitis B vaccine  You may need this if you have certain conditions or if you travel or work in places where you may be exposed to hepatitis B. Haemophilus influenzae type b (Hib) vaccine  You may need this if you have certain conditions. Human papillomavirus (HPV) vaccine  If recommended by your health care provider, you may need three doses over 6 months. You may receive vaccines as individual doses or as more than one vaccine together in one shot (combination vaccines). Talk with your health care provider about the risks and benefits of combination vaccines. What tests do I need? Blood tests  Lipid and cholesterol levels. These may be checked every 5 years, or more frequently if you are over 50 years old.  Hepatitis C test.  Hepatitis B test. Screening  Lung cancer screening. You may have this screening every year starting at age 55 if you have a 30-pack-year history of smoking and currently smoke or have quit within the past 15 years.  Colorectal cancer screening. All adults should have this screening starting at age 50 and continuing until age 75. Your health care provider may recommend screening at age 45 if you are at increased risk. You will have tests every 1-10 years, depending on your results and the type of screening test.  Diabetes screening. This is done by checking your blood sugar (glucose) after you have not eaten for a while (fasting). You may have this   done every 1-3 years.  Mammogram. This may be done every 1-2 years. Talk with your health care provider about when you should start having regular mammograms. This may depend on whether you have a family history of breast cancer.  BRCA-related cancer screening. This may be done if you have a family history of breast, ovarian, tubal, or peritoneal  cancers.  Pelvic exam and Pap test. This may be done every 3 years starting at age 70. Starting at age 87, this may be done every 5 years if you have a Pap test in combination with an HPV test. Other tests  Sexually transmitted disease (STD) testing.  Bone density scan. This is done to screen for osteoporosis. You may have this scan if you are at high risk for osteoporosis. Follow these instructions at home: Eating and drinking  Eat a diet that includes fresh fruits and vegetables, whole grains, lean protein, and low-fat dairy.  Take vitamin and mineral supplements as recommended by your health care provider.  Do not drink alcohol if: ? Your health care provider tells you not to drink. ? You are pregnant, may be pregnant, or are planning to become pregnant.  If you drink alcohol: ? Limit how much you have to 0-1 drink a day. ? Be aware of how much alcohol is in your drink. In the [place] one drink equals one 12 oz bottle of beer (355 mL), one 5 oz glass of wine (148 mL), or one 1 oz glass of hard liquor (44 mL). Lifestyle  Take daily care of your teeth and gums.  Stay active. Exercise for at least 30 minutes on 5 or more days each week.  Do not use any products that contain nicotine or tobacco, such as cigarettes, e-cigarettes, and chewing tobacco. If you need help quitting, ask your health care provider.  If you are sexually active, practice safe sex. Use a condom or other form of birth control (contraception) in order to prevent pregnancy and STIs (sexually transmitted infections).  If told by your health care provider, take low-dose aspirin daily starting at age 59. What's next?  Visit your health care provider once a year for a well check visit.  Ask your health care provider how often you should have your eyes and teeth checked.  Stay up to date on all vaccines. This information is not intended to replace advice given to you by your health care provider. Make sure you  discuss any questions you have with your health care provider. Document Revised: 08/12/2018 Document Reviewed: 08/12/2018 Elsevier Patient Education  2020 [employer] Breast self-awareness is knowing how your breasts look and feel. Doing breast self-awareness is important. It allows you to catch a breast problem early while it is still small and can be treated. All women should do breast self-awareness, including women who have had breast implants. Tell your doctor if you notice a change in your breasts. What you need:  A mirror.  A well-lit room. How to do a breast self-exam A breast self-exam is one way to learn what is normal for your breasts and to check for changes. To do a breast self-exam: Look for changes  1. Take off all the clothes above your waist. 2. Stand in front of a mirror in a room with good lighting. 3. Put your hands on your hips. 4. Push your hands down. 5. Look at your breasts and nipples in the mirror to see if one breast or nipple looks different  other. Check to see if: ? The shape of one breast is different. ? The size of one breast is different. ? There are wrinkles, dips, and bumps in one breast and not the other. 6. Look at each breast for changes in the skin, such as: ? Redness. ? Scaly areas. 7. Look for changes in your nipples, such as: ? Liquid around the nipples. ? Bleeding. ? Dimpling. ? Redness. ? A change in where the nipples are. Feel for changes  1. Lie on your back on the floor. 2. Feel each breast. To do this, follow these steps: ? Pick a breast to feel. ? Put the arm closest to that breast above your head. ? Use your other arm to feel the nipple area of your breast. Feel the area with the pads of your three middle fingers by making small circles with your fingers. For the first circle, press lightly. For the second circle, press harder. For the third circle, press even harder. ? Keep making circles with  your fingers at the different pressures as you move down your breast. Stop when you feel your ribs. ? Move your fingers a little toward the center of your body. ? Start making circles with your fingers again, this time going up until you reach your collarbone. ? Keep making up-and-down circles until you reach your armpit. Remember to keep using the three pressures. ? Feel the other breast in the same way. 3. Sit or stand in the tub or shower. 4. With soapy water on your skin, feel each breast the same way you did in step 2 when you were lying on the floor. Write down what you find Writing down what you find can help you remember what to tell your doctor. Write down:  What is normal for each breast.  Any changes you find in each breast, including: ? The kind of changes you find. ? Whether you have pain. ? Size and location of any lumps.  When you last had your menstrual period. General tips  Check your breasts every month.  If you are breastfeeding, the best time to check your breasts is after you feed your baby or after you use a breast pump.  If you get menstrual periods, the best time to check your breasts is 5-7 days after your menstrual period is over.  With time, you will become comfortable with the self-exam, and you will begin to know if there are changes in your breasts. Contact a doctor if you:  See a change in the shape or size of your breasts or nipples.  See a change in the skin of your breast or nipples, such as red or scaly skin.  Have fluid coming from your nipples that is not normal.  Find a lump or thick area that was not there before.  Have pain in your breasts.  Have any concerns about your breast health. Summary  Breast self-awareness includes looking for changes in your breasts, as well as feeling for changes within your breasts.  Breast self-awareness should be done in front of a mirror in a well-lit room.  You should check your breasts every month.  If you get menstrual periods, the best time to check your breasts is 5-7 days after your menstrual period is over.  Let your doctor know of any changes you see in your breasts, including changes in size, changes on the skin, pain or tenderness, or fluid from your nipples that is not normal. This information is not   is not intended to replace advice given to you by your health care provider. Make sure you discuss any questions you have with your health care provider. Document Revised: 07/20/2018 Document Reviewed: 07/20/2018 Elsevier Patient Education  [employer].

## 2020-07-10 NOTE — Progress Notes (Signed)
Pt present for annual exam. Pt stated that she was doing well no problems.  

## 2020-07-11 ENCOUNTER — Encounter: Payer: Self-pay | Admitting: Obstetrics and Gynecology

## 2020-07-11 ENCOUNTER — Ambulatory Visit (INDEPENDENT_AMBULATORY_CARE_PROVIDER_SITE_OTHER): Payer: Managed Care, Other (non HMO) | Admitting: Obstetrics and Gynecology

## 2020-07-11 VITALS — BP 96/65 | HR 81 | Ht 64.0 in | Wt 118.7 lb

## 2020-07-11 DIAGNOSIS — Z1231 Encounter for screening mammogram for malignant neoplasm of breast: Secondary | ICD-10-CM | POA: Diagnosis not present

## 2020-07-11 DIAGNOSIS — Z131 Encounter for screening for diabetes mellitus: Secondary | ICD-10-CM

## 2020-07-11 DIAGNOSIS — Z13220 Encounter for screening for lipoid disorders: Secondary | ICD-10-CM

## 2020-07-11 DIAGNOSIS — Z3041 Encounter for surveillance of contraceptive pills: Secondary | ICD-10-CM

## 2020-07-11 DIAGNOSIS — Z124 Encounter for screening for malignant neoplasm of cervix: Secondary | ICD-10-CM

## 2020-07-11 DIAGNOSIS — Z01419 Encounter for gynecological examination (general) (routine) without abnormal findings: Secondary | ICD-10-CM | POA: Diagnosis not present

## 2020-07-11 MED ORDER — NORETHIN ACE-ETH ESTRAD-FE 1-20 MG-MCG PO TABS: 1.0000 | ORAL_TABLET | Freq: Every day | ORAL | 4 refills | Status: DC

## 2020-07-11 NOTE — Addendum Note (Signed)
Addended by: Fabian November on: 07/11/2020 08:59 AM   Modules accepted: Orders

## 2020-07-11 NOTE — Progress Notes (Signed)
GYNECOLOGY ANNUAL PHYSICAL EXAM PROGRESS NOTE  Subjective:    Lindsay Santos is a married 39 y.o. G76P2002 female who presents for an annual exam. The patient has no complaints today. She patient is sexually active. The patient wears seatbelts: yes. The patient participates in regular exercise: yes. Has the patient ever been transfused or tattooed?: no.     Gynecologic History:  Patient's last menstrual period was 06/03/2020. Period Duration (Days): 3-4 Period Pattern: Regular Menstrual Flow: Moderate Menstrual Control: Tampon Menstrual Control Change Freq (Hours): 2-3 Dysmenorrhea: (!) Moderate Dysmenorrhea Symptoms: Cramping, Headache  Contraception: OCP (estrogen/progesterone) - continuous method History of STI's: Denies Last Pap: 04/2017. Results were: normal.  Denies h/o abnormal pap smears. Last mammogram: never had one.    OB History  Gravida Para Term Preterm AB Living  2 2 2  0 0 2  SAB TAB Ectopic Multiple Live Births  0 0 0 0 2    # Outcome Date GA Lbr Len/2nd Weight Sex Delivery Anes PTL Lv  2 Term 10/23/11    F Vag-Spont   LIV  1 Term 12/20/07    M VBAC   LIV    Past Medical History:  Diagnosis Date  . Acne   . Grief   . Hydronephrosis   . Panic anxiety syndrome     Past Surgical History:  Procedure Laterality Date  . ADENOIDECTOMY    . KIDNEY SURGERY     rt kidney stent and removal    Family History  Problem Relation Age of Onset  . Pulmonary disease Mother   . Healthy Father   . Healthy Maternal Grandmother   . Diabetes Maternal Grandfather   . Dementia Paternal Grandmother   . Dementia Paternal Grandfather   . Cancer Neg Hx   . Heart disease Neg Hx     Social History   Socioeconomic History  . Marital status: Married    Spouse name: Not on file  . Number of children: Not on file  . Years of education: Not on file  . Highest education level: Not on file  Occupational History  . Not on file  Tobacco Use  . Smoking status:  Never Smoker  . Smokeless tobacco: Never Used  Vaping Use  . Vaping Use: Never used  Substance and Sexual Activity  . Alcohol use: Yes    Comment: moderate alcohol - glass of wine at nite  . Drug use: No  . Sexual activity: Yes    Birth control/protection: Pill  Other Topics Concern  . Not on file  Social History Narrative  . Not on file   Social Determinants of Health   Financial Resource Strain:   . Difficulty of Paying Living Expenses:   Food Insecurity:   . Worried About 02/17/08 in the Last Year:   . Programme researcher, broadcasting/film/video in the Last Year:   Transportation Needs:   . Barista (Medical):   Freight forwarder Lack of Transportation (Non-Medical):   Physical Activity:   . Days of Exercise per Week:   . Minutes of Exercise per Session:   Stress:   . Feeling of Stress :   Social Connections:   . Frequency of Communication with Friends and Family:   . Frequency of Social Gatherings with Friends and Family:   . Attends Religious Services:   . Active Member of Clubs or Organizations:   . Attends [employer] Meetings:   Banker Marital Status:   Intimate [place] [employer]  Violence:   . Fear of Current or Ex-Partner:   . Emotionally Abused:   [place] [employer] Physically Abused:   . Sexually Abused:     Current Outpatient Medications on File Prior to Visit  Medication Sig Dispense Refill  . norethindrone-ethinyl estradiol (JUNEL FE 1/20) 1-20 MG-MCG tablet Take 1 tablet by mouth daily. Take continuously. Skipping placebo pills. 112 tablet 4  . spironolactone (ALDACTONE) 50 MG tablet Take 50 mg by mouth daily.     No current facility-administered medications on file prior to visit.    No Known Allergies    Review of Systems Constitutional: negative for chills, fatigue, fevers and sweats Eyes: negative for irritation, redness and visual disturbance Ears, nose, mouth, throat, and face: negative for hearing loss, nasal congestion, snoring and tinnitus Respiratory: negative for asthma,  cough, sputum Cardiovascular: negative for chest pain, dyspnea, exertional chest pressure/discomfort, irregular heart beat, palpitations and syncope Gastrointestinal: negative for abdominal pain, change in bowel habits, nausea and vomiting Genitourinary: negative for abnormal menstrual periods, genital lesions, sexual problems and vaginal discharge, dysuria and urinary incontinence Integument/breast: negative for breast lump, breast tenderness and nipple discharge Hematologic/lymphatic: negative for bleeding and easy bruising Musculoskeletal:negative for back pain and muscle weakness Neurological: negative for dizziness, headaches, vertigo and weakness Endocrine: negative for diabetic symptoms including polydipsia, polyuria and skin dryness Allergic/Immunologic: negative for hay fever and urticaria        Objective:  Blood pressure 96/65, pulse 81, height 5\' 4"  (1.626 m), weight 118 lb 11.2 oz (53.8 kg), last menstrual period 06/03/2020. Body mass index is 20.37 kg/m.  General Appearance:    Alert, cooperative, no distress, appears stated age  Head:    Normocephalic, without obvious abnormality, atraumatic  Eyes:    PERRL, conjunctiva/corneas clear, EOM's intact, both eyes  Ears:    Normal external ear canals, both ears  Nose:   Nares normal, septum midline, mucosa normal, no drainage or sinus tenderness  Throat:   Lips, mucosa, and tongue normal; teeth and gums normal  Neck:   Supple, symmetrical, trachea midline, no adenopathy; thyroid: no enlargement/tenderness/nodules; no carotid bruit or JVD  Back:     Symmetric, no curvature, ROM normal, no CVA tenderness  Lungs:     Clear to auscultation bilaterally, respirations unlabored  Chest Wall:    No tenderness or deformity   Heart:    Regular rate and rhythm, S1 and S2 normal, no murmur, rub or gallop  Breast Exam:    No tenderness, masses, or nipple abnormality  Abdomen:     Soft, non-tender, bowel sounds active all four quadrants, no  masses, no organomegaly.    Genitalia:    Pelvic:external genitalia normal, vagina without lesions, discharge, or tenderness, rectovaginal septum  normal. Cervix normal in appearance, no cervical motion tenderness, no adnexal masses or tenderness.  Uterus normal size, shape, mobile, regular contours, nontender.  Rectal:    Normal external sphincter.  No hemorrhoids appreciated. Internal exam not done.   Extremities:   Extremities normal, atraumatic, no cyanosis or edema  Pulses:   2+ and symmetric all extremities  Skin:   Skin color, texture, turgor normal, no rashes or lesions  Lymph nodes:   Cervical, supraclavicular, and axillary nodes normal  Neurologic:   CNII-XII intact, normal strength, sensation and reflexes throughout   .  Labs:  Lab Results  Component Value Date   WBC 6.8 07/29/2019   HGB 16.8 (H) 07/29/2019   HCT 51.8 (H) 07/29/2019   MCV 94 07/29/2019  PLT 283 07/29/2019     Lab Results  Component Value Date   TSH 3.770 07/29/2019     Assessment:   Encounter for well woman exam with routine gynecological exam Lipid screening Breast cancer screening by mammogram Screening for diabetes mellitus Encounter for surveillance of contraceptive pills  Plan:     Blood tests: see orders ([employer] Employee). Breast self exam technique reviewed and patient encouraged to perform self-exam monthly. Contraception: OCP (estrogen/progesterone). Refill given on medication.  Discussed healthy lifestyle modifications. Pap smear performed today. Patient has completed COVID vaccination series.  Due for tetanus shot, notes that she will wait until flu season to receive both at her local pharmacy.  Follow up in 1 year for annual exam.    Hildred Laser, MD [facility]

## 2020-07-12 LAB — COMPREHENSIVE METABOLIC PANEL
ALT: 14 IU/L (ref 0–32)
AST: 17 IU/L (ref 0–40)
Albumin/Globulin Ratio: 1.6 (ref 1.2–2.2)
Albumin: 4.3 g/dL (ref 3.8–4.8)
Alkaline Phosphatase: 39 IU/L — ABNORMAL LOW (ref 48–121)
BUN/Creatinine Ratio: 18 (ref 9–23)
BUN: 18 mg/dL (ref 6–24)
Bilirubin Total: 0.3 mg/dL (ref 0.0–1.2)
CO2: 22 mmol/L (ref 20–29)
Calcium: 9.5 mg/dL (ref 8.7–10.2)
Chloride: 99 mmol/L (ref 96–106)
Creatinine, Ser: 0.98 mg/dL (ref 0.57–1.00)
GFR calc Af Amer: 83 mL/min/{1.73_m2} (ref >59)
GFR calc non Af Amer: 72 mL/min/{1.73_m2} (ref >59)
Globulin, Total: 2.7 g/dL (ref 1.5–4.5)
Glucose: 70 mg/dL (ref 65–99)
Potassium: 4.7 mmol/L (ref 3.5–5.2)
Sodium: 136 mmol/L (ref 134–144)
Total Protein: 7 g/dL (ref 6.0–8.5)

## 2020-07-12 LAB — CBC
Hematocrit: 49.3 % — ABNORMAL HIGH (ref 34.0–46.6)
Hemoglobin: 16.1 g/dL — ABNORMAL HIGH (ref 11.1–15.9)
MCH: 31 pg (ref 26.6–33.0)
MCHC: 32.7 g/dL (ref 31.5–35.7)
MCV: 95 fL (ref 79–97)
Platelets: 246 10*3/uL (ref 150–450)
RBC: 5.19 x10E6/uL (ref 3.77–5.28)
RDW: 12.3 % (ref 11.7–15.4)
WBC: 6.9 10*3/uL (ref 3.4–10.8)

## 2020-07-12 LAB — LIPID PANEL
Chol/HDL Ratio: 2.7 ratio (ref 0.0–4.4)
Cholesterol, Total: 202 mg/dL — ABNORMAL HIGH (ref 100–199)
HDL: 76 mg/dL (ref >39)
LDL Chol Calc (NIH): 105 mg/dL — ABNORMAL HIGH (ref 0–99)
Triglycerides: 121 mg/dL (ref 0–149)
VLDL Cholesterol Cal: 21 mg/dL (ref 5–40)

## 2020-07-12 LAB — HEMOGLOBIN A1C
Est. average glucose Bld gHb Est-mCnc: 103 mg/dL
Hgb A1c MFr Bld: 5.2 % (ref 4.8–5.6)

## 2020-07-12 LAB — TSH: TSH: 2.95 u[IU]/mL (ref 0.450–4.500)

## 2020-07-16 LAB — IGP, COBASHPV16/18
HPV 16: NEGATIVE
HPV 18: NEGATIVE
HPV other hr types: NEGATIVE

## 2021-05-02 ENCOUNTER — Encounter: Payer: Self-pay | Admitting: Obstetrics and Gynecology

## 2021-05-02 ENCOUNTER — Ambulatory Visit (INDEPENDENT_AMBULATORY_CARE_PROVIDER_SITE_OTHER): Payer: Managed Care, Other (non HMO) | Admitting: Obstetrics and Gynecology

## 2021-05-02 ENCOUNTER — Other Ambulatory Visit: Payer: Self-pay

## 2021-05-02 VITALS — Ht 64.0 in

## 2021-05-02 DIAGNOSIS — Z8616 Personal history of COVID-19: Secondary | ICD-10-CM

## 2021-05-02 NOTE — Progress Notes (Signed)
Pt having televisit due to needing covid clearance before a cruise trip. Weight and bp not documented due to pt was unable to provide. Pt stated that she was doing well and was no longer having any covid symptoms.

## 2021-05-02 NOTE — Progress Notes (Signed)
Virtual Visit via Telephone Note  I connected with Lindsay Santos on 05/02/21 at 11:30 AM EDT by telephone and verified that I am speaking with the correct person using two identifiers.  Location: Patient: Home Provider: Office   I discussed the limitations, risks, security and privacy concerns of performing an evaluation and management service by telephone and the availability of in person appointments. I also discussed with the patient that there may be a patient responsible charge related to this service. The patient expressed understanding and agreed to proceed.   History of Present Illness:   Lindsay Santos is a 40 y.o. G63P2002 female who presents to attempt to receive a letter for clearance.  Patient reports that she is planning on going on a cruise in June, however was diagnosed with COVID-19 infection on May 5, after experiencing symptoms that resembled allergies (sneezing, mild cough, stuffy nose).  She has tested negative on May 15, and denies any residual symptoms.  She also reports that no other family members had any symptoms, and none of her family members tested positive.  She had them tested approximately 5 days after her testing.  Reports that the cruise line requests a clearance letter from a physician stating that she is safe to travel.   Observations/Objective: Height 5\' 4"  (1.626 m), last menstrual period 04/07/2021. Gen App: no acute distress by phone  Assessment and Plan: History of COVID-19 infection  Follow Up Instructions: After discussion with patient, I do agree to provide her letter as she has had recent negative testing, and is no longer symptomatic.  She will also be out of the window of viral transmission through active infection by the time she leaves for her cruise.   I discussed the assessment and treatment plan with the patient. The patient was provided an opportunity to ask questions and all were answered. The patient agreed with the plan and  demonstrated an understanding of the instructions.   The patient was advised to call back or seek an in-person evaluation if the symptoms worsen or if the condition fails to improve as anticipated.  I provided 7 minutes of non-face-to-face time during this encounter.   04/09/2021, MD [facility]

## 2021-07-17 ENCOUNTER — Encounter: Payer: Self-pay | Admitting: Obstetrics and Gynecology

## 2021-07-17 ENCOUNTER — Ambulatory Visit (INDEPENDENT_AMBULATORY_CARE_PROVIDER_SITE_OTHER): Payer: Managed Care, Other (non HMO) | Admitting: Obstetrics and Gynecology

## 2021-07-17 ENCOUNTER — Other Ambulatory Visit: Payer: Self-pay

## 2021-07-17 VITALS — BP 99/62 | HR 81 | Resp 16 | Ht 64.0 in | Wt 119.6 lb

## 2021-07-17 DIAGNOSIS — L700 Acne vulgaris: Secondary | ICD-10-CM | POA: Diagnosis not present

## 2021-07-17 DIAGNOSIS — Z23 Encounter for immunization: Secondary | ICD-10-CM

## 2021-07-17 DIAGNOSIS — Z114 Encounter for screening for human immunodeficiency virus [HIV]: Secondary | ICD-10-CM | POA: Diagnosis not present

## 2021-07-17 DIAGNOSIS — Z01419 Encounter for gynecological examination (general) (routine) without abnormal findings: Secondary | ICD-10-CM | POA: Diagnosis not present

## 2021-07-17 DIAGNOSIS — L678 Other hair color and hair shaft abnormalities: Secondary | ICD-10-CM

## 2021-07-17 DIAGNOSIS — Z1159 Encounter for screening for other viral diseases: Secondary | ICD-10-CM | POA: Diagnosis not present

## 2021-07-17 MED ORDER — DROSPIRENONE-ETHINYL ESTRADIOL 3-0.03 MG PO TABS: 1.0000 | ORAL_TABLET | Freq: Every day | ORAL | 3 refills | Status: DC

## 2021-07-17 NOTE — Progress Notes (Signed)
GYNECOLOGY ANNUAL PHYSICAL EXAM PROGRESS NOTE  Subjective:    Lindsay Santos is a married 40 y.o. G36P2002 female who presents for an annual exam.  She patient is sexually active. The patient wears seatbelts: yes. The patient participates in regular exercise: yes. Has the patient ever been transfused or tattooed?: no.   The patient has the following complaints today:  She reports that she has been experiencing more acne and facial hair growth.  She is currently on spironolactone for her issues but lately has been feeling like that has not been helping as much.  She desires to have her hormone levels checked.  She also needs a refill on her birth control pills.   Menstrual History:  Patient's last menstrual period was 06/07/2021. Period Duration (Days): 3-4 Period Pattern: Regular Menstrual Flow: Moderate Menstrual Control: Tampon Menstrual Control Change Freq (Hours): 2-3 Dysmenorrhea: (!) Moderate Dysmenorrhea Symptoms: Cramping, Headache   Gynecologic History:  Contraception: OCP (estrogen/progesterone) - continuous method History of STI's: Denies Last Pap: 07/11/2020. Results were: normal.  Denies h/o abnormal pap smears. Last mammogram: 09/2020 (performed at [facility]).  Results were: Abnormal, BI-RADS 0, grouped calcifications in right breast.  Had subsequent every 6 month follow-up of right breast which has been normal.     Upstream - 07/17/21 0826       Pregnancy Intention Screening   Does the patient want to become pregnant in the next year? No    Does the patient's partner want to become pregnant in the next year? No    Would the patient like to discuss contraceptive options today? No      Contraception Wrap Up   Current Method Oral Contraceptive    End Method Oral Contraceptive            The pregnancy intention screening data noted above was reviewed. Potential methods of contraception were discussed. The patient elected to proceed with Oral  Contraceptive.  OB History  Gravida Para Term Preterm AB Living  2 2 2  0 0 2  SAB IAB Ectopic Multiple Live Births  0 0 0 0 2    # Outcome Date GA Lbr Len/2nd Weight Sex Delivery Anes PTL Lv  2 Term 10/23/11    F Vag-Spont   LIV  1 Term 12/20/07    M VBAC   LIV    Past Medical History:  Diagnosis Date   Acne    Grief    Hydronephrosis    Panic anxiety syndrome     Past Surgical History:  Procedure Laterality Date   ADENOIDECTOMY     KIDNEY SURGERY     rt kidney stent and removal    Family History  Problem Relation Age of Onset   Pulmonary disease Mother    Healthy Father    Healthy Maternal Grandmother    Diabetes Maternal Grandfather    Dementia Paternal Grandmother    Dementia Paternal Grandfather    Cancer Neg Hx    Heart disease Neg Hx     Social History   Socioeconomic History   Marital status: Married    Spouse name: Not on file   Number of children: Not on file   Years of education: Not on file   Highest education level: Not on file  Occupational History   Not on file  Tobacco Use   Smoking status: Never   Smokeless tobacco: Never  Vaping Use   Vaping Use: Never used  Substance and Sexual Activity   Alcohol use:  Yes    Comment: moderate alcohol - glass of wine at nite   Drug use: No   Sexual activity: Yes    Birth control/protection: Pill  Other Topics Concern   Not on file  Social History Narrative   Not on file   Social Determinants of Health   Financial Resource Strain: Not on file  Food Insecurity: Not on file  Transportation Needs: Not on file  Physical Activity: Not on file  Stress: Not on file  Social Connections: Not on file  Intimate Partner Violence: Not on file    Current Outpatient Medications on File Prior to Visit  Medication Sig Dispense Refill   norethindrone-ethinyl estradiol (JUNEL FE 1/20) 1-20 MG-MCG tablet Take 1 tablet by mouth daily. Take continuously. Skipping placebo pills. 112 tablet 4   Probiotic Product  (PROBIOTIC DAILY PO) Take by mouth.     spironolactone (ALDACTONE) 50 MG tablet Take 50 mg by mouth daily.     No current facility-administered medications on file prior to visit.    No Known Allergies   Review of Systems Constitutional: negative for chills, fatigue, fevers and sweats Eyes: negative for irritation, redness and visual disturbance Ears, nose, mouth, throat, and face: negative for hearing loss, nasal congestion, snoring and tinnitus Respiratory: negative for asthma, cough, sputum Cardiovascular: negative for chest pain, dyspnea, exertional chest pressure/discomfort, irregular heart beat, palpitations and syncope Gastrointestinal: negative for abdominal pain, change in bowel habits, nausea and vomiting Genitourinary: negative for abnormal menstrual periods, genital lesions, sexual problems and vaginal discharge, dysuria and urinary incontinence Integument/breast: negative for breast lump, breast tenderness and nipple discharge Hematologic/lymphatic: negative for bleeding and easy bruising Musculoskeletal:negative for back pain and muscle weakness Neurological: negative for dizziness, headaches, vertigo and weakness Endocrine: negative for diabetic symptoms including polydipsia, polyuria and skin dryness Allergic/Immunologic: negative for hay fever and urticaria Skin: no sores or suspicious lesions or rashes or color changes    Objective:  Blood pressure 99/62, pulse 81, resp. rate 16, height 5\' 4"  (1.626 m), weight 119 lb 9.6 oz (54.3 kg), last menstrual period 06/07/2021. Body mass index is 20.53 kg/m.  General Appearance:    Alert, cooperative, no distress, appears stated age  Head:    Normocephalic, without obvious abnormality, atraumatic  Eyes:    PERRL, conjunctiva/corneas clear, EOM's intact, both eyes  Ears:    Normal external ear canals, both ears  Nose:   Nares normal, septum midline, mucosa normal, no drainage or sinus tenderness  Throat:   Lips, mucosa, and  tongue normal; teeth and gums normal  Neck:   Supple, symmetrical, trachea midline, no adenopathy; thyroid: no enlargement/tenderness/nodules; no carotid bruit or JVD  Back:     Symmetric, no curvature, ROM normal, no CVA tenderness  Lungs:     Clear to auscultation bilaterally, respirations unlabored  Chest Wall:    No tenderness or deformity   Heart:    Regular rate and rhythm, S1 and S2 normal, no murmur, rub or gallop  Breast Exam:    No tenderness, masses, or nipple abnormality  Abdomen:     Soft, non-tender, bowel sounds active all four quadrants, no masses, no organomegaly.    Genitalia:    Pelvic:external genitalia normal, vagina without lesions, discharge, or tenderness, rectovaginal septum  normal. Cervix normal in appearance, no cervical motion tenderness, no adnexal masses or tenderness.  Uterus normal size, shape, mobile, regular contours, nontender.  Rectal:    Normal external sphincter.  No hemorrhoids appreciated. Internal exam not done.  Extremities:   Extremities normal, atraumatic, no cyanosis or edema  Pulses:   2+ and symmetric all extremities  Skin:   Skin color, texture, turgor normal, no rashes or lesions  Lymph nodes:   Cervical, supraclavicular, and axillary nodes normal  Neurologic:   CNII-XII intact, normal strength, sensation and reflexes throughout   .  Labs:  Lab Results  Component Value Date   WBC 6.9 07/11/2020   HGB 16.1 (H) 07/11/2020   HCT 49.3 (H) 07/11/2020   MCV 95 07/11/2020   PLT 246 07/11/2020    Lab Results  Component Value Date   CREATININE 0.98 07/11/2020   BUN 18 07/11/2020   NA 136 07/11/2020   K 4.7 07/11/2020   CL 99 07/11/2020   CO2 22 07/11/2020    Lab Results  Component Value Date   ALT 14 07/11/2020   AST 17 07/11/2020   ALKPHOS 39 (L) 07/11/2020   BILITOT 0.3 07/11/2020    Lab Results  Component Value Date   TSH 2.950 07/11/2020     Assessment:   1. Well female exam with routine gynecological exam   2.  Encounter for screening for HIV   3. Need for hepatitis C screening test   4. Acne vulgaris   5. Abnormal facial hair   6. Need for Tdap vaccination      Plan:  Blood tests: see orders.  Breast self exam technique reviewed and patient encouraged to perform self-exam monthly. Contraception: OCP (estrogen/progesterone).  Discuss changing birth control pills to a more anti-androgenic option due to symptoms of acne and facial hair growth.  Patient okay to try.  Will prescribe Yasmin.  Patient can also take these continuously.  If she is noticing improvement she can also discontinue the spironolactone. Discussed healthy lifestyle modifications. Pap smear performed up to date.  Mammogram up to date. Continue yearly screening.  Next screening due in October.  Patient has completed COVID vaccination series. Discussed recommendations for hepatitis C screening at least once in her lifetime.  Will order today with additional labs. Patient overdue for tetanus vaccination.  Is okay to receive Tdap today. Follow up in 1 year for annual exam.     Hildred Laser, MD [facility]

## 2021-07-17 NOTE — Patient Instructions (Signed)
Preventive Care 21-42 Years Old, Female Preventive care refers to lifestyle choices and visits with your health care provider that can promote health and wellness. This includes: A yearly physical exam. This is also called an annual wellness visit. Regular dental and eye exams. Immunizations. Screening for certain conditions. Healthy lifestyle choices, such as: Eating a healthy diet. Getting regular exercise. Not using drugs or products that contain nicotine and tobacco. Limiting alcohol use. What can I expect for my preventive care visit? Physical exam Your health care provider may check your: Height and weight. These may be used to calculate your BMI (body mass index). BMI is a measurement that tells if you are at a healthy weight. Heart rate and blood pressure. Body temperature. Skin for abnormal spots. Counseling Your health care provider may ask you questions about your: Past medical problems. Family's medical history. Alcohol, tobacco, and drug use. Emotional well-being. Home life and relationship well-being. Sexual activity. Diet, exercise, and sleep habits. Work and work environment. Access to firearms. Method of birth control. Menstrual cycle. Pregnancy history. What immunizations do I need?  Vaccines are usually given at various ages, according to a schedule. Your health care provider will recommend vaccines for you based on your age, medicalhistory, and lifestyle or other factors, such as travel or where you work. What tests do I need?  Blood tests Lipid and cholesterol levels. These may be checked every 5 years starting at age 20. Hepatitis C test. Hepatitis B test. Screening Diabetes screening. This is done by checking your blood sugar (glucose) after you have not eaten for a while (fasting). STD (sexually transmitted disease) testing, if you are at risk. BRCA-related cancer screening. This may be done if you have a family history of breast, ovarian, tubal, or  peritoneal cancers. Pelvic exam and Pap test. This may be done every 3 years starting at age 21. Starting at age 30, this may be done every 5 years if you have a Pap test in combination with an HPV test. Talk with your health care provider about your test results, treatment options,and if necessary, the need for more tests. Follow these instructions at home: Eating and drinking  Eat a healthy diet that includes fresh fruits and vegetables, whole grains, lean protein, and low-fat dairy products. Take vitamin and mineral supplements as recommended by your health care provider. Do not drink alcohol if: Your health care provider tells you not to drink. You are pregnant, may be pregnant, or are planning to become pregnant. If you drink alcohol: Limit how much you have to 0-1 drink a day. Be aware of how much alcohol is in your drink. In the [place] one drink equals one 12 oz bottle of beer (355 mL), one 5 oz glass of wine (148 mL), or one 1 oz glass of hard liquor (44 mL).  Lifestyle Take daily care of your teeth and gums. Brush your teeth every morning and night with fluoride toothpaste. Floss one time each day. Stay active. Exercise for at least 30 minutes 5 or more days each week. Do not use any products that contain nicotine or tobacco, such as cigarettes, e-cigarettes, and chewing tobacco. If you need help quitting, ask your health care provider. Do not use drugs. If you are sexually active, practice safe sex. Use a condom or other form of protection to prevent STIs (sexually transmitted infections). If you do not wish to become pregnant, use a form of birth control. If you plan to become pregnant, see your health care   provider for a prepregnancy visit. Find healthy ways to cope with stress, such as: Meditation, yoga, or listening to music. Journaling. Talking to a trusted person. Spending time with friends and family. Safety Always wear your seat belt while driving or riding in a  vehicle. Do not drive: If you have been drinking alcohol. Do not ride with someone who has been drinking. When you are tired or distracted. While texting. Wear a helmet and other protective equipment during sports activities. If you have firearms in your house, make sure you follow all gun safety procedures. Seek help if you have been physically or sexually abused. What's next? Go to your health care provider once a year for an annual wellness visit. Ask your health care provider how often you should have your eyes and teeth checked. Stay up to date on all vaccines. This information is not intended to replace advice given to you by your health care provider. Make sure you discuss any questions you have with your healthcare provider. Document Revised: 07/29/2020 Document Reviewed: 08/12/2018 Elsevier Patient Education  2022 [employer] Breast Self-Awareness Breast self-awareness is knowing how your breasts look and feel. Doing breast self-awareness is important. It allows you to catch a breast problem early while it is still small and can be treated. All women should do breast self-awareness, including women who have had breast implants. Tell your doctorif you notice a change in your breasts. What you need: A mirror. A well-lit room. How to do a breast self-exam A breast self-exam is one way to learn what is normal for your breasts and tocheck for changes. To do a breast self-exam: Look for changes  Take off all the clothes above your waist. Stand in front of a mirror in a room with good lighting. Put your hands on your hips. Push your hands down. Look at your breasts and nipples in the mirror to see if one breast or nipple looks different from the other. Check to see if: The shape of one breast is different. The size of one breast is different. There are wrinkles, dips, and bumps in one breast and not the other. Look at each breast for changes in the skin, such as: Redness. Scaly  areas. Look for changes in your nipples, such as: Liquid around the nipples. Bleeding. Dimpling. Redness. A change in where the nipples are.  Feel for changes  Lie on your back on the floor. Feel each breast. To do this, follow these steps: Pick a breast to feel. Put the arm closest to that breast above your head. Use your other arm to feel the nipple area of your breast. Feel the area with the pads of your three middle fingers by making small circles with your fingers. For the first circle, press lightly. For the second circle, press harder. For the third circle, press even harder. Keep making circles with your fingers at the different pressures as you move down your breast. Stop when you feel your ribs. Move your fingers a little toward the center of your body. Start making circles with your fingers again, this time going up until you reach your collarbone. Keep making up-and-down circles until you reach your armpit. Remember to keep using the three pressures. Feel the other breast in the same way. Sit or stand in the tub or shower. With soapy water on your skin, feel each breast the same way you did in step 2 when you were lying on the floor.  Write down what you find   Writing down what you find can help you remember what to tell your doctor. Write down: What is normal for each breast. Any changes you find in each breast, including: The kind of changes you find. Whether you have pain. Size and location of any lumps. When you last had your menstrual period. General tips Check your breasts every month. If you are breastfeeding, the best time to check your breasts is after you feed your baby or after you use a breast pump. If you get menstrual periods, the best time to check your breasts is 5-7 days after your menstrual period is over. With time, you will become comfortable with the self-exam, and you will begin to know if there are changes in your breasts. Contact a doctor if  you: See a change in the shape or size of your breasts or nipples. See a change in the skin of your breast or nipples, such as red or scaly skin. Have fluid coming from your nipples that is not normal. Find a lump or thick area that was not there before. Have pain in your breasts. Have any concerns about your breast health. Summary Breast self-awareness includes looking for changes in your breasts, as well as feeling for changes within your breasts. Breast self-awareness should be done in front of a mirror in a well-lit room. You should check your breasts every month. If you get menstrual periods, the best time to check your breasts is 5-7 days after your menstrual period is over. Let your doctor know of any changes you see in your breasts, including changes in size, changes on the skin, pain or tenderness, or fluid from your nipples that is not normal. This information is not intended to replace advice given to you by your health care provider. Make sure you discuss any questions you have with your healthcare provider. Document Revised: 07/20/2018 Document Reviewed: 07/20/2018 Elsevier Patient Education  2022 [employer]  

## 2021-07-21 LAB — LIPID PANEL
Chol/HDL Ratio: 2.7 ratio (ref 0.0–4.4)
Cholesterol, Total: 233 mg/dL — ABNORMAL HIGH (ref 100–199)
HDL: 86 mg/dL (ref >39)
LDL Chol Calc (NIH): 128 mg/dL — ABNORMAL HIGH (ref 0–99)
Triglycerides: 111 mg/dL (ref 0–149)
VLDL Cholesterol Cal: 19 mg/dL (ref 5–40)

## 2021-07-21 LAB — HEMOGLOBIN A1C
Est. average glucose Bld gHb Est-mCnc: 97 mg/dL
Hgb A1c MFr Bld: 5 % (ref 4.8–5.6)

## 2021-07-21 LAB — CBC
Hematocrit: 50 % — ABNORMAL HIGH (ref 34.0–46.6)
Hemoglobin: 16.3 g/dL — ABNORMAL HIGH (ref 11.1–15.9)
MCH: 30.7 pg (ref 26.6–33.0)
MCHC: 32.6 g/dL (ref 31.5–35.7)
MCV: 94 fL (ref 79–97)
Platelets: 208 10*3/uL (ref 150–450)
RBC: 5.31 x10E6/uL — ABNORMAL HIGH (ref 3.77–5.28)
RDW: 12.7 % (ref 11.7–15.4)
WBC: 6.1 10*3/uL (ref 3.4–10.8)

## 2021-07-21 LAB — COMPREHENSIVE METABOLIC PANEL
ALT: 36 IU/L — ABNORMAL HIGH (ref 0–32)
AST: 43 IU/L — ABNORMAL HIGH (ref 0–40)
Albumin/Globulin Ratio: 1.9 (ref 1.2–2.2)
Albumin: 4.7 g/dL (ref 3.8–4.8)
Alkaline Phosphatase: 39 IU/L — ABNORMAL LOW (ref 44–121)
BUN/Creatinine Ratio: 19 (ref 9–23)
BUN: 15 mg/dL (ref 6–24)
Bilirubin Total: 0.7 mg/dL (ref 0.0–1.2)
CO2: 25 mmol/L (ref 20–29)
Calcium: 9.8 mg/dL (ref 8.7–10.2)
Chloride: 99 mmol/L (ref 96–106)
Creatinine, Ser: 0.78 mg/dL (ref 0.57–1.00)
Globulin, Total: 2.5 g/dL (ref 1.5–4.5)
Glucose: 82 mg/dL (ref 65–99)
Potassium: 4.9 mmol/L (ref 3.5–5.2)
Sodium: 139 mmol/L (ref 134–144)
Total Protein: 7.2 g/dL (ref 6.0–8.5)
eGFR: 97 mL/min/{1.73_m2} (ref >59)

## 2021-07-21 LAB — DHEA-SULFATE: DHEA-SO4: 75.5 ug/dL (ref 57.3–279.2)

## 2021-07-21 LAB — ESTRADIOL: Estradiol: 42.7 pg/mL

## 2021-07-21 LAB — TESTOSTERONE, FREE, TOTAL, SHBG
Sex Hormone Binding: 184 nmol/L — ABNORMAL HIGH (ref 24.6–122.0)
Testosterone, Free: 3.7 pg/mL (ref 0.0–4.2)
Testosterone: 13 ng/dL (ref 4–50)

## 2021-07-21 LAB — HEPATITIS C ANTIBODY: Hep C Virus Ab: <0.1 s/co ratio (ref 0.0–0.9)

## 2021-07-21 LAB — PROGESTERONE: Progesterone: 0.3 ng/mL

## 2021-07-21 LAB — FSH/LH
FSH: 7.5 m[IU]/mL
LH: 1.9 m[IU]/mL

## 2021-07-24 DIAGNOSIS — R748 Abnormal levels of other serum enzymes: Secondary | ICD-10-CM

## 2021-09-05 ENCOUNTER — Other Ambulatory Visit: Payer: Self-pay | Admitting: Obstetrics and Gynecology

## 2021-10-16 MED ORDER — NORETHIN ACE-ETH ESTRAD-FE 1-20 MG-MCG PO TABS: 1.0000 | ORAL_TABLET | Freq: Every day | ORAL | 3 refills | Status: DC

## 2021-11-21 ENCOUNTER — Other Ambulatory Visit: Payer: Managed Care, Other (non HMO)

## 2021-11-21 ENCOUNTER — Other Ambulatory Visit: Payer: Self-pay

## 2021-11-21 DIAGNOSIS — R748 Abnormal levels of other serum enzymes: Secondary | ICD-10-CM

## 2021-11-22 LAB — HEPATIC FUNCTION PANEL
ALT: 16 IU/L (ref 0–32)
AST: 22 IU/L (ref 0–40)
Albumin: 4.7 g/dL (ref 3.8–4.8)
Alkaline Phosphatase: 45 IU/L (ref 44–121)
Bilirubin Total: 0.5 mg/dL (ref 0.0–1.2)
Bilirubin, Direct: 0.12 mg/dL (ref 0.00–0.40)
Total Protein: 7.2 g/dL (ref 6.0–8.5)

## 2022-08-11 NOTE — Progress Notes (Unsigned)
GYNECOLOGY ANNUAL PHYSICAL EXAM PROGRESS NOTE  Subjective:    Lindsay Santos is a 41 y.o. G48P2002 female who presents for an annual exam. The patient has no complaints today. The patient {is/is not/has never been:13135} sexually active. The patient participates in regular exercise: {yes/no/not asked:9010}. Has the patient ever been transfused or tattooed?: {yes/no/not asked:9010}. The patient reports that there {is/is not:9024} domestic violence in her life.    Menstrual History: Menarche age: *** No LMP recorded.     Gynecologic History:  Contraception: OCP (estrogen/progesterone) History of STI's: Denies Last Pap: 04/22/2020. Results were: normal.  Denies h/o abnormal pap smears. Last mammogram: 01/10/2022. Results were: abnormal. Calcifications in right breast.  Had subsequent every 6 month follow-up of right breast.   OB History  Gravida Para Term Preterm AB Living  2 2 2  0 0 2  SAB IAB Ectopic Multiple Live Births  0 0 0 0 2    # Outcome Date GA Lbr Len/2nd Weight Sex Delivery Anes PTL Lv  2 Term 10/23/11    F Vag-Spont   LIV  1 Term 12/20/07    M VBAC   LIV    Past Medical History:  Diagnosis Date   Acne    Grief    Hydronephrosis    Panic anxiety syndrome     Past Surgical History:  Procedure Laterality Date   ADENOIDECTOMY     KIDNEY SURGERY     rt kidney stent and removal    Family History  Problem Relation Age of Onset   Pulmonary disease Mother    Healthy Father    Healthy Maternal Grandmother    Diabetes Maternal Grandfather    Dementia Paternal Grandmother    Dementia Paternal Grandfather    Cancer Neg Hx    Heart disease Neg Hx     Social History   Socioeconomic History   Marital status: Married    Spouse name: Not on file   Number of children: Not on file   Years of education: Not on file   Highest education level: Not on file  Occupational History   Not on file  Tobacco Use   Smoking status: Never   Smokeless tobacco:  Never  Vaping Use   Vaping Use: Never used  Substance and Sexual Activity   Alcohol use: Yes    Comment: moderate alcohol - glass of wine at nite   Drug use: No   Sexual activity: Yes    Birth control/protection: Pill  Other Topics Concern   Not on file  Social History Narrative   Not on file   Social Determinants of Health   Financial Resource Strain: Not on file  Food Insecurity: Not on file  Transportation Needs: Not on file  Physical Activity: Insufficiently Active (07/08/2019)   Exercise Vital Sign    Days of Exercise per Week: 1 day    Minutes of Exercise per Session: 30 min  Stress: Not on file  Social Connections: Not on file  Intimate Partner Violence: Not on file    Current Outpatient Medications on File Prior to Visit  Medication Sig Dispense Refill   norethindrone-ethinyl estradiol-FE (JUNEL FE 1/20) 1-20 MG-MCG tablet Take 1 tablet by mouth daily. 84 tablet 3   Probiotic Product (PROBIOTIC DAILY PO) Take by mouth.     spironolactone (ALDACTONE) 100 MG tablet Take 100 mg by mouth daily.     No current facility-administered medications on file prior to visit.    No Known Allergies  Review of Systems Constitutional: negative for chills, fatigue, fevers and sweats Eyes: negative for irritation, redness and visual disturbance Ears, nose, mouth, throat, and face: negative for hearing loss, nasal congestion, snoring and tinnitus Respiratory: negative for asthma, cough, sputum Cardiovascular: negative for chest pain, dyspnea, exertional chest pressure/discomfort, irregular heart beat, palpitations and syncope Gastrointestinal: negative for abdominal pain, change in bowel habits, nausea and vomiting Genitourinary: negative for abnormal menstrual periods, genital lesions, sexual problems and vaginal discharge, dysuria and urinary incontinence Integument/breast: negative for breast lump, breast tenderness and nipple discharge Hematologic/lymphatic: negative for  bleeding and easy bruising Musculoskeletal:negative for back pain and muscle weakness Neurological: negative for dizziness, headaches, vertigo and weakness Endocrine: negative for diabetic symptoms including polydipsia, polyuria and skin dryness Allergic/Immunologic: negative for hay fever and urticaria      Objective:  There were no vitals taken for this visit. There is no height or weight on file to calculate BMI.    General Appearance:    Alert, cooperative, no distress, appears stated age  Head:    Normocephalic, without obvious abnormality, atraumatic  Eyes:    PERRL, conjunctiva/corneas clear, EOM's intact, both eyes  Ears:    Normal external ear canals, both ears  Nose:   Nares normal, septum midline, mucosa normal, no drainage or sinus tenderness  Throat:   Lips, mucosa, and tongue normal; teeth and gums normal  Neck:   Supple, symmetrical, trachea midline, no adenopathy; thyroid: no enlargement/tenderness/nodules; no carotid bruit or JVD  Back:     Symmetric, no curvature, ROM normal, no CVA tenderness  Lungs:     Clear to auscultation bilaterally, respirations unlabored  Chest Wall:    No tenderness or deformity   Heart:    Regular rate and rhythm, S1 and S2 normal, no murmur, rub or gallop  Breast Exam:    No tenderness, masses, or nipple abnormality  Abdomen:     Soft, non-tender, bowel sounds active all four quadrants, no masses, no organomegaly.    Genitalia:    Pelvic:external genitalia normal, vagina without lesions, discharge, or tenderness, rectovaginal septum  normal. Cervix normal in appearance, no cervical motion tenderness, no adnexal masses or tenderness.  Uterus normal size, shape, mobile, regular contours, nontender.  Rectal:    Normal external sphincter.  No hemorrhoids appreciated. Internal exam not done.   Extremities:   Extremities normal, atraumatic, no cyanosis or edema  Pulses:   2+ and symmetric all extremities  Skin:   Skin color, texture, turgor normal,  no rashes or lesions  Lymph nodes:   Cervical, supraclavicular, and axillary nodes normal  Neurologic:   CNII-XII intact, normal strength, sensation and reflexes throughout   .  Labs:  Lab Results  Component Value Date   WBC 6.1 07/17/2021   HGB 16.3 (H) 07/17/2021   HCT 50.0 (H) 07/17/2021   MCV 94 07/17/2021   PLT 208 07/17/2021    Lab Results  Component Value Date   CREATININE 0.78 07/17/2021   BUN 15 07/17/2021   NA 139 07/17/2021   K 4.9 07/17/2021   CL 99 07/17/2021   CO2 25 07/17/2021    Lab Results  Component Value Date   ALT 16 11/21/2021   AST 22 11/21/2021   ALKPHOS 45 11/21/2021   BILITOT 0.5 11/21/2021    Lab Results  Component Value Date   TSH 2.950 07/11/2020     Assessment:   No diagnosis found.   Plan:  Blood tests: Ordered. Breast self exam technique reviewed and  patient encouraged to perform self-exam monthly. Contraception: OCP (estrogen/progesterone). Discussed healthy lifestyle modifications. Mammogram  UTD Pap smear ordered. COVID vaccination status: Follow up in 1 year for annual exam   Chilton Greathouse, CMA [facility]

## 2022-08-11 NOTE — Patient Instructions (Incomplete)
Preventive Care 40-43 Years Old, Female Preventive care refers to lifestyle choices and visits with your health care provider that can promote health and wellness. Preventive care visits are also called wellness exams. What can I expect for my preventive care visit? Counseling Your health care provider may ask you questions about your: Medical history, including: Past medical problems. Family medical history. Pregnancy history. Current health, including: Menstrual cycle. Method of birth control. Emotional well-being. Home life and relationship well-being. Sexual activity and sexual health. Lifestyle, including: Alcohol, nicotine or tobacco, and drug use. Access to firearms. Diet, exercise, and sleep habits. Work and work environment. Sunscreen use. Safety issues such as seatbelt and bike helmet use. Physical exam Your health care provider will check your: Height and weight. These may be used to calculate your BMI (body mass index). BMI is a measurement that tells if you are at a healthy weight. Waist circumference. This measures the distance around your waistline. This measurement also tells if you are at a healthy weight and may help predict your risk of certain diseases, such as type 2 diabetes and high blood pressure. Heart rate and blood pressure. Body temperature. Skin for abnormal spots. What immunizations do I need?  Vaccines are usually given at various ages, according to a schedule. Your health care provider will recommend vaccines for you based on your age, medical history, and lifestyle or other factors, such as travel or where you work. What tests do I need? Screening Your health care provider may recommend screening tests for certain conditions. This may include: Lipid and cholesterol levels. Diabetes screening. This is done by checking your blood sugar (glucose) after you have not eaten for a while (fasting). Pelvic exam and Pap test. Hepatitis B test. Hepatitis C  test. HIV (human immunodeficiency virus) test. STI (sexually transmitted infection) testing, if you are at risk. Lung cancer screening. Colorectal cancer screening. Mammogram. Talk with your health care provider about when you should start having regular mammograms. This may depend on whether you have a family history of breast cancer. BRCA-related cancer screening. This may be done if you have a family history of breast, ovarian, tubal, or peritoneal cancers. Bone density scan. This is done to screen for osteoporosis. Talk with your health care provider about your test results, treatment options, and if necessary, the need for more tests. Follow these instructions at home: Eating and drinking  Eat a diet that includes fresh fruits and vegetables, whole grains, lean protein, and low-fat dairy products. Take vitamin and mineral supplements as recommended by your health care provider. Do not drink alcohol if: Your health care provider tells you not to drink. You are pregnant, may be pregnant, or are planning to become pregnant. If you drink alcohol: Limit how much you have to 0-1 drink a day. Know how much alcohol is in your drink. In the [place] one drink equals one 12 oz bottle of beer (355 mL), one 5 oz glass of wine (148 mL), or one 1 oz glass of hard liquor (44 mL). Lifestyle Brush your teeth every morning and night with fluoride toothpaste. Floss one time each day. Exercise for at least 30 minutes 5 or more days each week. Do not use any products that contain nicotine or tobacco. These products include cigarettes, chewing tobacco, and vaping devices, such as e-cigarettes. If you need help quitting, ask your health care provider. Do not use drugs. If you are sexually active, practice safe sex. Use a condom or other form of protection to   prevent STIs. If you do not wish to become pregnant, use a form of birth control. If you plan to become pregnant, see your health care provider for a  prepregnancy visit. Take aspirin only as told by your health care provider. Make sure that you understand how much to take and what form to take. Work with your health care provider to find out whether it is safe and beneficial for you to take aspirin daily. Find healthy ways to manage stress, such as: Meditation, yoga, or listening to music. Journaling. Talking to a trusted person. Spending time with friends and family. Minimize exposure to UV radiation to reduce your risk of skin cancer. Safety Always wear your seat belt while driving or riding in a vehicle. Do not drive: If you have been drinking alcohol. Do not ride with someone who has been drinking. When you are tired or distracted. While texting. If you have been using any mind-altering substances or drugs. Wear a helmet and other protective equipment during sports activities. If you have firearms in your house, make sure you follow all gun safety procedures. Seek help if you have been physically or sexually abused. What's next? Visit your health care provider once a year for an annual wellness visit. Ask your health care provider how often you should have your eyes and teeth checked. Stay up to date on all vaccines. This information is not intended to replace advice given to you by your health care provider. Make sure you discuss any questions you have with your health care provider. Document Revised: 05/29/2021 Document Reviewed: 05/29/2021 Elsevier Patient Education  [place].

## 2022-08-12 ENCOUNTER — Encounter: Payer: Self-pay | Admitting: Obstetrics and Gynecology

## 2022-08-12 ENCOUNTER — Ambulatory Visit (INDEPENDENT_AMBULATORY_CARE_PROVIDER_SITE_OTHER): Payer: Managed Care, Other (non HMO) | Admitting: Obstetrics and Gynecology

## 2022-08-12 VITALS — BP 103/76 | HR 65 | Ht 64.0 in | Wt 119.1 lb

## 2022-08-12 DIAGNOSIS — Z01419 Encounter for gynecological examination (general) (routine) without abnormal findings: Secondary | ICD-10-CM | POA: Diagnosis not present

## 2022-08-12 DIAGNOSIS — Z13220 Encounter for screening for lipoid disorders: Secondary | ICD-10-CM | POA: Diagnosis not present

## 2022-08-12 DIAGNOSIS — R252 Cramp and spasm: Secondary | ICD-10-CM

## 2022-08-12 DIAGNOSIS — Z131 Encounter for screening for diabetes mellitus: Secondary | ICD-10-CM | POA: Diagnosis not present

## 2022-08-12 DIAGNOSIS — L680 Hirsutism: Secondary | ICD-10-CM

## 2022-08-12 DIAGNOSIS — Z124 Encounter for screening for malignant neoplasm of cervix: Secondary | ICD-10-CM | POA: Diagnosis not present

## 2022-08-27 ENCOUNTER — Other Ambulatory Visit: Payer: Managed Care, Other (non HMO)

## 2022-08-27 DIAGNOSIS — Z01419 Encounter for gynecological examination (general) (routine) without abnormal findings: Secondary | ICD-10-CM

## 2022-08-27 DIAGNOSIS — Z13220 Encounter for screening for lipoid disorders: Secondary | ICD-10-CM

## 2022-08-28 LAB — LIPID PANEL
Chol/HDL Ratio: 2.3 ratio (ref 0.0–4.4)
Cholesterol, Total: 202 mg/dL — ABNORMAL HIGH (ref 100–199)
HDL: 89 mg/dL (ref >39)
LDL Chol Calc (NIH): 89 mg/dL (ref 0–99)
Triglycerides: 142 mg/dL (ref 0–149)
VLDL Cholesterol Cal: 24 mg/dL (ref 5–40)

## 2022-08-28 LAB — CBC
Hematocrit: 51 % — ABNORMAL HIGH (ref 34.0–46.6)
Hemoglobin: 16.4 g/dL — ABNORMAL HIGH (ref 11.1–15.9)
MCH: 30.6 pg (ref 26.6–33.0)
MCHC: 32.2 g/dL (ref 31.5–35.7)
MCV: 95 fL (ref 79–97)
Platelets: 249 10*3/uL (ref 150–450)
RBC: 5.36 x10E6/uL — ABNORMAL HIGH (ref 3.77–5.28)
RDW: 12.2 % (ref 11.7–15.4)
WBC: 6.8 10*3/uL (ref 3.4–10.8)

## 2022-08-28 LAB — COMPREHENSIVE METABOLIC PANEL
ALT: 19 IU/L (ref 0–32)
AST: 26 IU/L (ref 0–40)
Albumin/Globulin Ratio: 1.8 (ref 1.2–2.2)
Albumin: 4.6 g/dL (ref 3.9–4.9)
Alkaline Phosphatase: 57 IU/L (ref 44–121)
BUN/Creatinine Ratio: 10 (ref 9–23)
BUN: 10 mg/dL (ref 6–24)
Bilirubin Total: 0.5 mg/dL (ref 0.0–1.2)
CO2: 25 mmol/L (ref 20–29)
Calcium: 10.1 mg/dL (ref 8.7–10.2)
Chloride: 95 mmol/L — ABNORMAL LOW (ref 96–106)
Creatinine, Ser: 1 mg/dL (ref 0.57–1.00)
Globulin, Total: 2.6 g/dL (ref 1.5–4.5)
Glucose: 82 mg/dL (ref 70–99)
Potassium: 5 mmol/L (ref 3.5–5.2)
Sodium: 134 mmol/L (ref 134–144)
Total Protein: 7.2 g/dL (ref 6.0–8.5)
eGFR: 72 mL/min/{1.73_m2} (ref >59)

## 2022-08-28 LAB — TSH: TSH: 4.16 u[IU]/mL (ref 0.450–4.500)

## 2022-09-14 ENCOUNTER — Other Ambulatory Visit: Payer: Self-pay | Admitting: Obstetrics and Gynecology

## 2023-03-11 ENCOUNTER — Encounter: Payer: Self-pay | Admitting: Obstetrics and Gynecology

## 2023-03-11 MED ORDER — NORETHIN ACE-ETH ESTRAD-FE 1-20 MG-MCG PO TABS: 1.0000 | ORAL_TABLET | Freq: Every day | ORAL | 3 refills | Status: DC

## 2023-03-12 ENCOUNTER — Other Ambulatory Visit: Payer: Self-pay

## 2023-03-12 MED ORDER — NORETHIN ACE-ETH ESTRAD-FE 1-20 MG-MCG PO TABS: 1.0000 | ORAL_TABLET | Freq: Every day | ORAL | 3 refills | Status: DC

## 2023-05-25 ENCOUNTER — Encounter: Payer: Self-pay | Admitting: Obstetrics and Gynecology

## 2023-08-24 NOTE — Progress Notes (Unsigned)
GYNECOLOGY ANNUAL PHYSICAL EXAM PROGRESS NOTE  Subjective:    Lindsay Santos is a 42 y.o. G74P2002 female who presents for an annual exam. The patient is sexually active. The patient participates in regular exercise: yes. Has the patient ever been transfused or tattooed?: no. The patient reports that there is not domestic violence in her life.   The patient has the following complaints today:  Currently on continuous OCPs and has been on this regimen for some time. Reports spotting throughout the month that lasts for several days. Also noting some increase in pelvic pain.  Notes that her sister was diagnosed with breast cancer earlier this year, age 46. Had negative BRCA testing. Thinks the breast cancer was estrogen receptor positive. Patient states that she is now getting alternating every 6 moth mammograms and MRIs.  1st MRI noted a liver mass (suspected cyst) that has been stable on subsequent imaging.    Menstrual History: Menarche age: 63 No LMP recorded (lmp unknown).    Gynecologic History:  Contraception: OCP (estrogen/progesterone) History of STI's: Denies Last Pap: 07/11/2020. Results were: normal. Denies h/o abnormal pap smears. Last mammogram: 09/29/2022 (Diagnostic). Results were: BI-RADS Category: 2-Mammo1Yr : Benign.  Annual mammography is recommended, scheduled for next month.     OB History  Gravida Para Term Preterm AB Living  2 2 2  0 0 2  SAB IAB Ectopic Multiple Live Births  0 0 0 0 2    # Outcome Date GA Lbr Len/2nd Weight Sex Type Anes PTL Lv  2 Term 10/23/11    F Vag-Spont   LIV  1 Term 12/20/07    M VBAC   LIV    Past Medical History:  Diagnosis Date   Acne    Grief    Hydronephrosis    Panic anxiety syndrome     Past Surgical History:  Procedure Laterality Date   ADENOIDECTOMY     KIDNEY SURGERY     rt kidney stent and removal    Family History  Problem Relation Age of Onset   Pulmonary disease Mother    Healthy Father     Healthy Maternal Grandmother    Diabetes Maternal Grandfather    Dementia Paternal Grandmother    Dementia Paternal Grandfather    Cancer Neg Hx    Heart disease Neg Hx     Social History   Socioeconomic History   Marital status: Married    Spouse name: Not on file   Number of children: Not on file   Years of education: Not on file   Highest education level: Not on file  Occupational History   Not on file  Tobacco Use   Smoking status: Never   Smokeless tobacco: Never  Vaping Use   Vaping status: Never Used  Substance and Sexual Activity   Alcohol use: Yes    Comment: moderate alcohol - glass of wine at nite   Drug use: No   Sexual activity: Yes    Birth control/protection: Pill  Other Topics Concern   Not on file  Social History Narrative   Not on file   Social Determinants of Health   Financial Resource Strain: Not on file  Food Insecurity: Not on file  Transportation Needs: Not on file  Physical Activity: Insufficiently Active (07/08/2019)   Exercise Vital Sign    Days of Exercise per Week: 1 day    Minutes of Exercise per Session: 30 min  Stress: Not on file  Social Connections: Not  on file  Intimate Partner Violence: Not on file    Current Outpatient Medications on File Prior to Visit  Medication Sig Dispense Refill   norethindrone-ethinyl estradiol-FE (JUNEL FE 1/20) 1-20 MG-MCG tablet Take 1 tablet by mouth daily. Take pills continuously, skip placebo pills. 112 tablet 3   spironolactone (ALDACTONE) 100 MG tablet Take 100 mg by mouth daily.     No current facility-administered medications on file prior to visit.    No Known Allergies   Review of Systems Constitutional: negative for chills, fatigue, fevers and sweats Eyes: negative for irritation, redness and visual disturbance Ears, nose, mouth, throat, and face: negative for hearing loss, nasal congestion, snoring and tinnitus Respiratory: negative for asthma, cough, sputum Cardiovascular:  negative for chest pain, dyspnea, exertional chest pressure/discomfort, irregular heart beat, palpitations and syncope Gastrointestinal: negative for abdominal pain, change in bowel habits, nausea and vomiting Genitourinary: positive for intermittent spotting (see HPI).  Negative for genital lesions, sexual problems and vaginal discharge, dysuria and urinary incontinence Integument/breast: negative for breast lump, breast tenderness and nipple discharge Hematologic/lymphatic: negative for bleeding and easy bruising Musculoskeletal:negative for back pain and muscle weakness Neurological: negative for dizziness, headaches, vertigo and weakness Endocrine: negative for diabetic symptoms including polydipsia, polyuria and skin dryness Allergic/Immunologic: negative for hay fever and urticaria      Objective:  Blood pressure 95/76, pulse 66, resp. rate 16, height 5\' 4"  (1.626 m), weight 120 lb 3.2 oz (54.5 kg). Body mass index is 20.63 kg/m.    General Appearance:    Alert, cooperative, no distress, appears stated age  Head:    Normocephalic, without obvious abnormality, atraumatic  Eyes:    PERRL, conjunctiva/corneas clear, EOM's intact, both eyes  Ears:    Normal external ear canals, both ears  Nose:   Nares normal, septum midline, mucosa normal, no drainage or sinus tenderness  Throat:   Lips, mucosa, and tongue normal; teeth and gums normal  Neck:   Supple, symmetrical, trachea midline, no adenopathy; thyroid: no enlargement/tenderness/nodules; no carotid bruit or JVD  Back:     Symmetric, no curvature, ROM normal, no CVA tenderness  Lungs:     Clear to auscultation bilaterally, respirations unlabored  Chest Wall:    No tenderness or deformity   Heart:    Regular rate and rhythm, S1 and S2 normal, no murmur, rub or gallop  Breast Exam:    No tenderness, masses, or nipple abnormality  Abdomen:     Soft, non-tender, bowel sounds active all four quadrants, no masses, no organomegaly.     Genitalia:    Pelvic:external genitalia normal, vagina without lesions, discharge, or tenderness, rectovaginal septum  normal. Cervix normal in appearance, no cervical motion tenderness, no adnexal masses or tenderness.  Uterus normal size, shape, mobile, regular contours, nontender.  Rectal:    Normal external sphincter.  No hemorrhoids appreciated. Internal exam not done.   Extremities:   Extremities normal, atraumatic, no cyanosis or edema  Pulses:   2+ and symmetric all extremities  Skin:   Skin color, texture, turgor normal, no rashes or lesions  Lymph nodes:   Cervical, supraclavicular, and axillary nodes normal  Neurologic:   CNII-XII intact, normal strength, sensation and reflexes throughout   .  Labs:  Lab Results  Component Value Date   WBC 6.8 08/27/2022   HGB 16.4 (H) 08/27/2022   HCT 51.0 (H) 08/27/2022   MCV 95 08/27/2022   PLT 249 08/27/2022    Lab Results  Component Value Date  CREATININE 1.00 08/27/2022   BUN 10 08/27/2022   NA 134 08/27/2022   K 5.0 08/27/2022   CL 95 (L) 08/27/2022   CO2 25 08/27/2022    Lab Results  Component Value Date   ALT 19 08/27/2022   AST 26 08/27/2022   ALKPHOS 57 08/27/2022   BILITOT 0.5 08/27/2022    Lab Results  Component Value Date   TSH 4.160 08/27/2022    Lab Results  Component Value Date   HGBA1C 5.0 07/17/2021     Assessment:   1. Encounter for well woman exam with routine gynecological exam   2. Cervical cancer screening   3. Screening for diabetes mellitus (DM)   4. Screening cholesterol level   5. Encounter for screening mammogram for malignant neoplasm of breast   6. Family history of breast cancer in sister   48. Breakthrough bleeding on OCPs      Plan:  - Blood tests: Ordered. Patient will return for fasting labs.  - Breast self exam technique reviewed and patient encouraged to perform self-exam monthly. Continue q 6 month breast screens with MRI and mammography at this time.  - Mammogram  ordered - Contraception: OCP (estrogen/progesterone). Discussed increasing dosing to 30 mcg tablets for management of breakthrough spotting, or taking current pills BID for up to 3 days when episodes of spotting occur. Discussed risks and benefits of each option, in light of patient's new family history of breast cancer. Patient ok to increase to 30 mg dosing. Will prescribe.  - Discussed healthy lifestyle modifications. - Pap smear ordered. - Follow up in 1 year for annual exam   Hildred Laser, MD [facility]

## 2023-08-25 ENCOUNTER — Encounter: Payer: Self-pay | Admitting: Obstetrics and Gynecology

## 2023-08-25 ENCOUNTER — Ambulatory Visit (INDEPENDENT_AMBULATORY_CARE_PROVIDER_SITE_OTHER): Payer: Managed Care, Other (non HMO) | Admitting: Obstetrics and Gynecology

## 2023-08-25 ENCOUNTER — Other Ambulatory Visit (HOSPITAL_COMMUNITY)
Admission: RE | Admit: 2023-08-25 | Discharge: 2023-08-25 | Disposition: A | Discharge status: Home / Self Care | Payer: Managed Care, Other (non HMO) | Source: Ambulatory Visit | Attending: Obstetrics and Gynecology | Admitting: Obstetrics and Gynecology

## 2023-08-25 VITALS — BP 95/76 | HR 66 | Resp 16 | Ht 64.0 in | Wt 120.2 lb

## 2023-08-25 DIAGNOSIS — Z131 Encounter for screening for diabetes mellitus: Secondary | ICD-10-CM

## 2023-08-25 DIAGNOSIS — Z124 Encounter for screening for malignant neoplasm of cervix: Secondary | ICD-10-CM | POA: Insufficient documentation

## 2023-08-25 DIAGNOSIS — Z01419 Encounter for gynecological examination (general) (routine) without abnormal findings: Secondary | ICD-10-CM

## 2023-08-25 DIAGNOSIS — Z13220 Encounter for screening for lipoid disorders: Secondary | ICD-10-CM

## 2023-08-25 DIAGNOSIS — N921 Excessive and frequent menstruation with irregular cycle: Secondary | ICD-10-CM

## 2023-08-25 DIAGNOSIS — Z803 Family history of malignant neoplasm of breast: Secondary | ICD-10-CM | POA: Insufficient documentation

## 2023-08-25 DIAGNOSIS — Z1231 Encounter for screening mammogram for malignant neoplasm of breast: Secondary | ICD-10-CM

## 2023-08-25 MED ORDER — NORETHINDRONE ACET-ETHINYL EST 1.5-30 MG-MCG PO TABS: 1.0000 | ORAL_TABLET | Freq: Every day | ORAL | 4 refills | Status: DC

## 2023-08-25 NOTE — Patient Instructions (Signed)

## 2023-08-26 ENCOUNTER — Other Ambulatory Visit: Payer: Managed Care, Other (non HMO)

## 2023-08-26 DIAGNOSIS — Z13220 Encounter for screening for lipoid disorders: Secondary | ICD-10-CM

## 2023-08-26 DIAGNOSIS — Z131 Encounter for screening for diabetes mellitus: Secondary | ICD-10-CM

## 2023-08-26 DIAGNOSIS — Z01419 Encounter for gynecological examination (general) (routine) without abnormal findings: Secondary | ICD-10-CM

## 2023-08-27 LAB — CBC
Hematocrit: 49 % — ABNORMAL HIGH (ref 34.0–46.6)
Hemoglobin: 15.5 g/dL (ref 11.1–15.9)
MCH: 31.4 pg (ref 26.6–33.0)
MCHC: 31.6 g/dL (ref 31.5–35.7)
MCV: 99 fL — ABNORMAL HIGH (ref 79–97)
Platelets: 274 10*3/uL (ref 150–450)
RBC: 4.93 x10E6/uL (ref 3.77–5.28)
RDW: 11.8 % (ref 11.7–15.4)
WBC: 5.5 10*3/uL (ref 3.4–10.8)

## 2023-08-27 LAB — LIPID PANEL
Chol/HDL Ratio: 2.5 ratio (ref 0.0–4.4)
Cholesterol, Total: 211 mg/dL — ABNORMAL HIGH (ref 100–199)
HDL: 83 mg/dL (ref >39)
LDL Chol Calc (NIH): 107 mg/dL — ABNORMAL HIGH (ref 0–99)
Triglycerides: 121 mg/dL (ref 0–149)
VLDL Cholesterol Cal: 21 mg/dL (ref 5–40)

## 2023-08-27 LAB — COMPREHENSIVE METABOLIC PANEL
ALT: 19 IU/L (ref 0–32)
AST: 18 IU/L (ref 0–40)
Albumin: 4.2 g/dL (ref 3.9–4.9)
Alkaline Phosphatase: 43 IU/L — ABNORMAL LOW (ref 44–121)
BUN/Creatinine Ratio: 18 (ref 9–23)
BUN: 18 mg/dL (ref 6–24)
Bilirubin Total: 0.4 mg/dL (ref 0.0–1.2)
CO2: 23 mmol/L (ref 20–29)
Calcium: 9.4 mg/dL (ref 8.7–10.2)
Chloride: 101 mmol/L (ref 96–106)
Creatinine, Ser: 1 mg/dL (ref 0.57–1.00)
Globulin, Total: 2.6 g/dL (ref 1.5–4.5)
Glucose: 91 mg/dL (ref 70–99)
Potassium: 5.5 mmol/L — ABNORMAL HIGH (ref 3.5–5.2)
Sodium: 137 mmol/L (ref 134–144)
Total Protein: 6.8 g/dL (ref 6.0–8.5)
eGFR: 71 mL/min/{1.73_m2} (ref >59)

## 2023-08-27 LAB — HEMOGLOBIN A1C
Est. average glucose Bld gHb Est-mCnc: 103 mg/dL
Hgb A1c MFr Bld: 5.2 % (ref 4.8–5.6)

## 2023-08-27 LAB — TSH: TSH: 3.3 u[IU]/mL (ref 0.450–4.500)

## 2023-08-28 LAB — CYTOLOGY - PAP
Comment: NEGATIVE
Diagnosis: NEGATIVE
High risk HPV: NEGATIVE

## 2023-09-14 ENCOUNTER — Encounter: Payer: Self-pay | Admitting: Obstetrics and Gynecology

## 2023-09-14 MED ORDER — NORETHIN ACE-ETH ESTRAD-FE 1-20 MG-MCG PO TABS: 1.0000 | ORAL_TABLET | Freq: Every day | ORAL | 4 refills | Status: DC

## 2024-08-23 NOTE — Progress Notes (Addendum)
 PCP: Patient, No Pcp Per   Chief Complaint  Patient presents with   Gynecologic Exam    No concerns    HPI:      Ms. Lindsay Santos is a 43 y.o. [id number] whose LMP was No LMP recorded. (Menstrual status: Oral contraceptives)., presents today for her annual examination.  Her menses are absent with cont dosing OCPs, never takes placebo pills. Has occas BTB/dysmen, no meds needed. Is having occas VS sx and trouble sleeping.   Sex activity: single partner, contraception - OCP (estrogen/progesterone). She does have vaginal dryness, improved with lubricants. No pain/bleeding.  Last Pap: 08/25/23 Results were: no abnormalities /neg HPV DNA.   Last mammogram: 10/05/23 at [facility]; Results were: normal--routine follow-up in 12 months. Also had neg breast MRI 10/24 at [facility] due to increased risk of breast cancer. Has upcoming appt.  There is a FH of breast cancer in her sister; her sister is BRCA neg, but pt unsure if full panel tested vs BRCA only. There is no FH of ovarian cancer. The patient does do self-breast exams. Not taking Vit D supp.   Colonoscopy: never  Tobacco use: The patient denies current or previous tobacco use. Alcohol use: social drinker No drug use Exercise: moderately active  She does get adequate calcium but not Vitamin D  in her diet.  Borderline LDL on 9/24 labs. Wants to RTO fasting for lab appt. May want to do Vistaseq genetic testing then too [facility] employee).    Patient Active Problem List   Diagnosis Date Noted   Family history of breast cancer in sister 08/25/2023   Panic anxiety syndrome 04/01/2016    Past Surgical History:  Procedure Laterality Date   ADENOIDECTOMY     KIDNEY SURGERY     rt kidney stent and removal    Family History  Problem Relation Age of Onset   Pulmonary disease Mother    Healthy Father    Breast cancer Sister        45/46   Healthy Maternal Grandmother    Diabetes Maternal Grandfather    Dementia Paternal Grandmother     Dementia Paternal Grandfather    Heart disease Neg Hx     Social History   Socioeconomic History   Marital status: Married    Spouse name: Not on file   Number of children: Not on file   Years of education: Not on file   Highest education level: Not on file  Occupational History   Not on file  Tobacco Use   Smoking status: Never   Smokeless tobacco: Never  Vaping Use   Vaping status: Never Used  Substance and Sexual Activity   Alcohol use: Yes    Comment: moderate alcohol - glass of wine at nite   Drug use: No   Sexual activity: Yes    Birth control/protection: Pill  Other Topics Concern   Not on file  Social History Narrative   Not on file   Social Drivers of Health   Financial Resource Strain: Not on file  Food Insecurity: Not on file  Transportation Needs: Not on file  Physical Activity: Insufficiently Active (07/08/2019)   Exercise Vital Sign    Days of Exercise per Week: 1 day    Minutes of Exercise per Session: 30 min  Stress: Not on file  Social Connections: Not on file  Intimate Partner Violence: Not on file     Current Outpatient Medications:    pimecrolimus (ELIDEL) 1 % cream, Apply 1  Application topically 2 (two) times daily., Disp: , Rfl:    spironolactone (ALDACTONE) 100 MG tablet, Take 100 mg by mouth daily., Disp: , Rfl:    norethindrone -ethinyl estradiol -FE (JUNEL  FE 1/20) 1-20 MG-MCG tablet, Take 1 tablet by mouth daily. Take continuously, skip placebo pills., Disp: 112 tablet, Rfl: 4     ROS:  Review of Systems  Constitutional:  Negative for fatigue, fever and unexpected weight change.  Respiratory:  Negative for cough, shortness of breath and wheezing.   Cardiovascular:  Negative for chest pain, palpitations and leg swelling.  Gastrointestinal:  Positive for constipation. Negative for blood in stool, diarrhea, nausea and vomiting.  Endocrine: Negative for cold intolerance, heat intolerance and polyuria.  Genitourinary:  Negative for  dyspareunia, dysuria, flank pain, frequency, genital sores, hematuria, menstrual problem, pelvic pain, urgency, vaginal bleeding, vaginal discharge and vaginal pain.  Musculoskeletal:  Negative for back pain, joint swelling and myalgias.  Skin:  Negative for rash.  Neurological:  Positive for headaches. Negative for dizziness, syncope, light-headedness and numbness.  Hematological:  Negative for adenopathy.  Psychiatric/Behavioral:  Positive for agitation. Negative for confusion, dysphoric mood, sleep disturbance and suicidal ideas. The patient is not nervous/anxious.    BREAST: No symptoms    Objective: BP 103/63   Pulse 65   Ht 5' 4 (1.626 m)   Wt 118 lb (53.5 kg)   BMI 20.25 kg/m    Physical Exam Constitutional:      Appearance: She is well-developed.  Genitourinary:     Vulva normal.     Right Labia: No rash, tenderness or lesions.    Left Labia: No tenderness, lesions or rash.    No vaginal discharge, erythema or tenderness.      Right Adnexa: not tender and no mass present.    Left Adnexa: not tender and no mass present.    No cervical friability or polyp.     Uterus is not enlarged or tender.  Breasts:    Right: No mass, nipple discharge, skin change or tenderness.     Left: No mass, nipple discharge, skin change or tenderness.  Neck:     Thyroid: No thyromegaly.  Cardiovascular:     Rate and Rhythm: Normal rate and regular rhythm.     Heart sounds: Normal heart sounds. No murmur heard. Pulmonary:     Effort: Pulmonary effort is normal.     Breath sounds: Normal breath sounds.  Abdominal:     Palpations: Abdomen is soft.     Tenderness: There is no abdominal tenderness. There is no guarding or rebound.  Musculoskeletal:        General: Normal range of motion.     Cervical back: Normal range of motion.  Lymphadenopathy:     Cervical: No cervical adenopathy.  Neurological:     General: No focal deficit present.     Mental Status: She is alert and oriented  to person, place, and time.     Cranial Nerves: No cranial nerve deficit.  Skin:    General: Skin is warm and dry.  Psychiatric:        Mood and Affect: Mood normal.        Behavior: Behavior normal.        Thought Content: Thought content normal.        Judgment: Judgment normal.  Vitals reviewed.     Assessment/Plan:  Encounter for annual routine gynecological examination  Encounter for surveillance of contraceptive pills - Plan: norethindrone -ethinyl estradiol -FE (JUNEL  FE 1/20) 1-20  MG-MCG tablet; OCP RF eRxd.   Encounter for screening mammogram for malignant neoplasm of breast; pt schedules through [facility]  Family history of breast cancer--pt is BRCA neg; pt to see if panel neg. Discussed vistaseq for pt prn.   09/05/24 :ADDENDUM: pt messaged back stating sister had invitae testing with only 9 genes. Will do Vistaseq full panel for pt when comes for other labs.  Increased risk of breast cancer--aware of recommendations of monthly SBE, yearly CBE and mammos, as well as scr breast MRI, doing at [facility]. Add Vit D supp.   Screening for colon cancer - Plan: Cologuard; colonoscopy/cologuard discussed. Pt elects cologuard. Ref sent. Will f/u with results.  Blood tests for routine general physical examination - Plan: CBC with Differential/Platelet, Comprehensive metabolic panel with GFR, Lipid panel  Screening cholesterol level - Plan: Lipid panel   Meds ordered this encounter  Medications   norethindrone -ethinyl estradiol -FE (JUNEL  FE 1/20) 1-20 MG-MCG tablet    Sig: Take 1 tablet by mouth daily. Take continuously, skip placebo pills.    Dispense:  112 tablet    Refill:  4    Supervising Provider:   ROBY, MICIA [[id number]]            GYN counsel breast self exam, mammography screening, adequate intake of calcium and vitamin D , diet and exercise    F/U  Return in about 1 year (around 08/25/2025).  Add [place]oli B. Sharma Lawrance, PA-C 08/25/2024 12:27 PM

## 2024-08-25 ENCOUNTER — Encounter: Payer: Self-pay | Admitting: Obstetrics and Gynecology

## 2024-08-25 ENCOUNTER — Ambulatory Visit (INDEPENDENT_AMBULATORY_CARE_PROVIDER_SITE_OTHER): Admitting: Obstetrics and Gynecology

## 2024-08-25 VITALS — BP 103/63 | HR 65 | Ht 64.0 in | Wt 118.0 lb

## 2024-08-25 DIAGNOSIS — Z803 Family history of malignant neoplasm of breast: Secondary | ICD-10-CM

## 2024-08-25 DIAGNOSIS — Z1231 Encounter for screening mammogram for malignant neoplasm of breast: Secondary | ICD-10-CM

## 2024-08-25 DIAGNOSIS — Z3041 Encounter for surveillance of contraceptive pills: Secondary | ICD-10-CM

## 2024-08-25 DIAGNOSIS — Z01419 Encounter for gynecological examination (general) (routine) without abnormal findings: Secondary | ICD-10-CM | POA: Diagnosis not present

## 2024-08-25 DIAGNOSIS — Z9189 Other specified personal risk factors, not elsewhere classified: Secondary | ICD-10-CM

## 2024-08-25 DIAGNOSIS — Z13220 Encounter for screening for lipoid disorders: Secondary | ICD-10-CM

## 2024-08-25 DIAGNOSIS — Z0000 Encounter for general adult medical examination without abnormal findings: Secondary | ICD-10-CM

## 2024-08-25 DIAGNOSIS — Z1211 Encounter for screening for malignant neoplasm of colon: Secondary | ICD-10-CM

## 2024-08-25 MED ORDER — NORETHIN ACE-ETH ESTRAD-FE 1-20 MG-MCG PO TABS: 1.0000 | ORAL_TABLET | Freq: Every day | ORAL | 4 refills | Status: AC

## 2024-08-25 NOTE — Patient Instructions (Signed)
 I value your feedback and you entrusting [place] with your care. If you get a [facility] [place] [facility] patient survey, I would appreciate you taking the time to let us know about your experience today. Thank you! ? ? ?

## 2024-09-02 ENCOUNTER — Encounter: Payer: Self-pay | Admitting: Obstetrics and Gynecology

## 2024-09-05 NOTE — Addendum Note (Signed)
 Addended by: WATT HILA B on: 09/05/2024 01:06 PM   Modules accepted: Orders

## 2024-09-27 ENCOUNTER — Ambulatory Visit: Payer: Self-pay | Admitting: Obstetrics and Gynecology

## 2024-09-27 LAB — COLOGUARD: COLOGUARD: NEGATIVE

## 2024-12-03 ENCOUNTER — Telehealth: Admitting: Family Medicine

## 2024-12-03 DIAGNOSIS — J069 Acute upper respiratory infection, unspecified: Secondary | ICD-10-CM | POA: Diagnosis not present

## 2024-12-03 MED ORDER — BENZONATATE 100 MG PO CAPS: 100.0000 mg | ORAL_CAPSULE | Freq: Three times a day (TID) | ORAL | 0 refills | Status: AC | PRN

## 2024-12-03 MED ORDER — OLOPATADINE HCL 0.1 % OP SOLN: 1.0000 [drp] | Freq: Two times a day (BID) | OPHTHALMIC | 0 refills | Status: AC

## 2024-12-03 NOTE — Progress Notes (Signed)
 " Virtual Visit Consent   Lindsay Santos, you are scheduled for a virtual visit with a [facility] provider today. Just as with appointments in the office, your consent must be obtained to participate. Your consent will be active for this visit and any virtual visit you may have with one of our providers in the next 365 days. If you have a MyChart account, a copy of this consent can be sent to you electronically.  As this is a virtual visit, video technology does not allow for your provider to perform a traditional examination. This may limit your provider's ability to fully assess your condition. If your provider identifies any concerns that need to be evaluated in person or the need to arrange testing (such as labs, EKG, etc.), we will make arrangements to do so. Although advances in technology are sophisticated, we cannot ensure that it will always work on either your end or our end. If the connection with a video visit is poor, the visit may have to be switched to a telephone visit. With either a video or telephone visit, we are not always able to ensure that we have a secure connection.  By engaging in this virtual visit, you consent to the provision of healthcare and authorize for your insurance to be billed (if applicable) for the services provided during this visit. Depending on your insurance coverage, you may receive a charge related to this service.  I need to obtain your verbal consent now. Are you willing to proceed with your visit today? Lindsay Santos has provided verbal consent on 12/03/2024 for a virtual visit (video or telephone). Lindsay Santos, [place]  Date: 12/03/2024 2:22 PM   Virtual Visit via Video Note   I, Lindsay Santos, connected with  Lindsay Santos  ([id number], 1981/11/03) on 12/03/2024 at  2:15 PM EST by a video-enabled telemedicine application and verified that I am speaking with the correct person using two identifiers.  Location: Patient: Virtual Visit Location  Patient: Home Provider: Virtual Visit Location Provider: Home Office   I discussed the limitations of evaluation and management by telemedicine and the availability of in person appointments. The patient expressed understanding and agreed to proceed.    History of Present Illness: Lindsay Santos is a 43 y.o. who identifies as a female who was assigned female at birth, and is being seen today for c/o having of symptoms for a couple of days. Pt states she has a runny nose and watery eyes.  Pt states she has a little bit of a cough mostly at night, no fever or no body aches. Pt states she has taken OTC sudafed.  Pt states she has a lot of watery eyes that started last night.  Pt has swollen eyes as well.  Discussed with Pt allergy on file and Pt stated it was the topical cream they placed on her after birth caused burning.   HPI: HPI  Problems:  Patient Active Problem List   Diagnosis Date Noted   Family history of breast cancer in sister 08/25/2023   Panic anxiety syndrome 04/01/2016    Allergies: Allergies[1] Medications: Current Medications[2]  Observations/Objective: Patient is well-developed, well-nourished in no acute distress.  Resting comfortably at home.  Head is normocephalic, atraumatic.  No labored breathing.  Speech is clear and coherent with logical content.  Patient is alert and oriented at baseline.    Assessment and Plan: 1. Upper respiratory tract infection, unspecified type (Primary) - benzonatate  (TESSALON ) 100 MG capsule; Take  1 capsule (100 mg total) by mouth 3 (three) times daily as needed for up to 7 days.  Dispense: 21 capsule; Refill: 0 - olopatadine  (PATADAY ) 0.1 % ophthalmic solution; Place 1 drop into both eyes 2 (two) times daily for 14 days.  Dispense: 1.4 mL; Refill: 0  -Continue conservative treatments  -Advised Pt to stop tessalon  if side effect occurs -Pt to F/U with PCP [facility] urgent care for worsening symptoms  Follow Up Instructions: I discussed  the assessment and treatment plan with the patient. The patient was provided an opportunity to ask questions and all were answered. The patient agreed with the plan and demonstrated an understanding of the instructions.  A copy of instructions were sent to the patient via MyChart unless otherwise noted below.    The patient was advised to call back or seek an in-person evaluation if the symptoms worsen or if the condition fails to improve as anticipated.    Lindsay Mater, PA-C    [1]  Allergies Allergen Reactions   Benzocaine-Menthol Other (See Comments)  [2]  Current Outpatient Medications:    benzonatate  (TESSALON ) 100 MG capsule, Take 1 capsule (100 mg total) by mouth 3 (three) times daily as needed for up to 7 days., Disp: 21 capsule, Rfl: 0   olopatadine  (PATADAY ) 0.1 % ophthalmic solution, Place 1 drop into both eyes 2 (two) times daily for 14 days., Disp: 1.4 mL, Rfl: 0   norethindrone -ethinyl estradiol -FE (JUNEL  FE 1/20) 1-20 MG-MCG tablet, Take 1 tablet by mouth daily. Take continuously, skip placebo pills., Disp: 112 tablet, Rfl: 4   pimecrolimus (ELIDEL) 1 % cream, Apply 1 Application topically 2 (two) times daily., Disp: , Rfl:    spironolactone (ALDACTONE) 100 MG tablet, Take 100 mg by mouth daily., Disp: , Rfl:   "

## 2024-12-03 NOTE — Patient Instructions (Signed)
 " Lindsay Santos, thank you for joining [name], PA-C for today's virtual visit.  While this provider is not your primary care provider (PCP), if your PCP is located in our provider database this encounter information will be shared with them immediately following your visit.   A [facility] MyChart account gives you access to today's visit and all your visits, tests, and labs performed at [facility]  click here if you don't have a [facility] MyChart account or go to [email or web address]  Consent: (Patient) Lindsay Santos provided verbal consent for this virtual visit at the beginning of the encounter.  Current Medications:  Current Outpatient Medications:    benzonatate  (TESSALON ) 100 MG capsule, Take 1 capsule (100 mg total) by mouth 3 (three) times daily as needed for up to 7 days., Disp: 21 capsule, Rfl: 0   olopatadine  (PATADAY ) 0.1 % ophthalmic solution, Place 1 drop into both eyes 2 (two) times daily for 14 days., Disp: 1.4 mL, Rfl: 0   norethindrone -ethinyl estradiol -FE (JUNEL  FE 1/20) 1-20 MG-MCG tablet, Take 1 tablet by mouth daily. Take continuously, skip placebo pills., Disp: 112 tablet, Rfl: 4   pimecrolimus (ELIDEL) 1 % cream, Apply 1 Application topically 2 (two) times daily., Disp: , Rfl:    spironolactone (ALDACTONE) 100 MG tablet, Take 100 mg by mouth daily., Disp: , Rfl:    Medications ordered in this encounter:  Meds ordered this encounter  Medications   benzonatate  (TESSALON ) 100 MG capsule    Sig: Take 1 capsule (100 mg total) by mouth 3 (three) times daily as needed for up to 7 days.    Dispense:  21 capsule    Refill:  0   olopatadine  (PATADAY ) 0.1 % ophthalmic solution    Sig: Place 1 drop into both eyes 2 (two) times daily for 14 days.    Dispense:  1.4 mL    Refill:  0     *If you need refills on other medications prior to your next appointment, please contact your pharmacy*  Follow-Up: Call back or seek an in-person  evaluation if the symptoms worsen or if the condition fails to improve as anticipated.  [facility] [phone]  Other Instructions Upper Respiratory Infection, Adult An upper respiratory infection (URI) is a common viral infection of the nose, throat, and upper air passages that lead to the lungs. The most common type of URI is the common cold. URIs usually get better on their own, without medical treatment. What are the causes? A URI is caused by a virus. You may catch a virus by: Breathing in droplets from an infected person's cough or sneeze. Touching something that has been exposed to the virus (is contaminated) and then touching your mouth, nose, or eyes. What increases the risk? You are more likely to get a URI if: You are very young or very old. You have close contact with others, such as at work, school, or a health care facility. You smoke. You have long-term (chronic) heart or lung disease. You have a weakened disease-fighting system (immune system). You have nasal allergies or asthma. You are experiencing a lot of stress. You have poor nutrition. What are the signs or symptoms? A URI usually involves some of the following symptoms: Runny or stuffy (congested) nose. Cough. Sneezing. Sore throat. Headache. Fatigue. Fever. Loss of appetite. Pain in your forehead, behind your eyes, and over your cheekbones (sinus pain). Muscle aches. Redness or irritation of the eyes. Pressure in the ears  or face. How is this diagnosed? This condition may be diagnosed based on your medical history and symptoms, and a physical exam. Your health care provider may use a swab to take a mucus sample from your nose (nasal swab). This sample can be tested to determine what virus is causing the illness. How is this treated? URIs usually get better on their own within 7-10 days. Medicines cannot cure URIs, but your health care provider may recommend certain medicines to help  relieve symptoms, such as: Over-the-counter cold medicines. Cough suppressants. Coughing is a type of defense against infection that helps to clear the respiratory system, so take these medicines only as recommended by your health care provider. Fever-reducing medicines. Follow these instructions at home: Activity Rest as needed. If you have a fever, stay home from work or school until your fever is gone or until your health care provider says your URI cannot spread to other people (is no longer contagious). Your health care provider may have you wear a face mask to prevent your infection from spreading. Relieving symptoms Gargle with a mixture of salt and water 3-4 times a day or as needed. To make salt water, completely dissolve -1 tsp (3-6 g) of salt in 1 cup (237 mL) of warm water. Use a cool-mist humidifier to add moisture to the air. This can help you breathe more easily. Eating and drinking  Drink enough fluid to keep your urine pale yellow. Eat soups and other clear broths. General instructions  Take over-the-counter and prescription medicines only as told by your health care provider. These include cold medicines, fever reducers, and cough suppressants. Do not use any products that contain nicotine or tobacco. These products include cigarettes, chewing tobacco, and vaping devices, such as e-cigarettes. If you need help quitting, ask your health care provider. Stay away from secondhand smoke. Stay up to date on all immunizations, including the yearly (annual) flu vaccine. Keep all follow-up visits. This is important. How to prevent the spread of infection to others URIs can be contagious. To prevent the infection from spreading: Wash your hands with soap and water for at least 20 seconds. If soap and water are not available, use hand sanitizer. Avoid touching your mouth, face, eyes, or nose. Cough or sneeze into a tissue or your sleeve or elbow instead of into your hand or into the  air.  Contact a health care provider if: You are getting worse instead of better. You have a fever or chills. Your mucus is brown or red. You have yellow or brown discharge coming from your nose. You have pain in your face, especially when you bend forward. You have swollen neck glands. You have pain while swallowing. You have white areas in the back of your throat. Get help right away if: You have shortness of breath that gets worse. You have severe or persistent: Headache. Ear pain. Sinus pain. Chest pain. You have chronic lung disease along with any of the following: Making high-pitched whistling sounds when you breathe, most often when you breathe out (wheezing). Prolonged cough (more than 14 days). Coughing up blood. A change in your usual mucus. You have a stiff neck. You have changes in your: Vision. Hearing. Thinking. Mood. These symptoms may be an emergency. Get help right away. Call 911. Do not wait to see if the symptoms will go away. Do not drive yourself to the hospital. Summary An upper respiratory infection (URI) is a common infection of the nose, throat, and upper air passages  that lead to the lungs. A URI is caused by a virus. URIs usually get better on their own within 7-10 days. Medicines cannot cure URIs, but your health care provider may recommend certain medicines to help relieve symptoms. This information is not intended to replace advice given to you by your health care provider. Make sure you discuss any questions you have with your health care provider. Document Revised: 07/03/2021 Document Reviewed: 07/03/2021 Elsevier Patient Education  2024 Elsevier Inc.   If you have been instructed to have an in-person evaluation today at a local Urgent Care facility, please use the link below. It will take you to a list of all of our available [facility], including address, phone number and hours of operation. Please do not delay care.  [facility]  If you or a family member do not have a primary care provider, use the link below to schedule a visit and establish care. When you choose a [facility] primary care physician or advanced practice provider, you gain a long-term partner in health. Find a Primary Care Provider  Learn more about [facility] in-office and virtual care options: [facility] - Get Care Now  "
